# Patient Record
Sex: Female | Born: 1990 | Race: Black or African American | Hispanic: No | Marital: Single | Smoking: Never smoker
Health system: Southern US, Community
[De-identification: ages and names within clinical notes are randomized; demographics above are authoritative.]

## PROBLEM LIST (undated history)

## (undated) DIAGNOSIS — Z789 Other specified health status: Secondary | ICD-10-CM

## (undated) HISTORY — PX: CHOLECYSTECTOMY: SHX55

---

## 2010-12-17 ENCOUNTER — Emergency Department (HOSPITAL_COMMUNITY): Payer: Federal, State, Local not specified - PPO

## 2010-12-17 ENCOUNTER — Inpatient Hospital Stay (HOSPITAL_COMMUNITY)
Admission: EM | Admit: 2010-12-17 | Discharge: 2010-12-20 | DRG: 494 | Disposition: A | Discharge status: Home / Self Care | Payer: Federal, State, Local not specified - PPO | Attending: General Surgery | Admitting: General Surgery

## 2010-12-17 DIAGNOSIS — K8000 Calculus of gallbladder with acute cholecystitis without obstruction: Principal | ICD-10-CM | POA: Diagnosis present

## 2010-12-17 LAB — CBC
HCT: 39.1 % (ref 36.0–46.0)
MCH: 27.7 pg (ref 26.0–34.0)
MCHC: 34.8 g/dL (ref 30.0–36.0)
RDW: 13.2 % (ref 11.5–15.5)

## 2010-12-17 LAB — DIFFERENTIAL
Basophils Absolute: 0 10*3/uL (ref 0.0–0.1)
Basophils Relative: 1 % (ref 0–1)
Eosinophils Relative: 4 % (ref 0–5)
Monocytes Absolute: 0.5 10*3/uL (ref 0.1–1.0)

## 2010-12-17 LAB — URINALYSIS, ROUTINE W REFLEX MICROSCOPIC
Ketones, ur: NEGATIVE mg/dL
Protein, ur: NEGATIVE mg/dL
Urobilinogen, UA: 1 mg/dL (ref 0.0–1.0)

## 2010-12-17 LAB — HEPATIC FUNCTION PANEL
ALT: 197 U/L — ABNORMAL HIGH (ref 0–35)
AST: 283 U/L — ABNORMAL HIGH (ref 0–37)
Albumin: 3.4 g/dL — ABNORMAL LOW (ref 3.5–5.2)
Alkaline Phosphatase: 139 U/L — ABNORMAL HIGH (ref 39–117)
Total Protein: 7.1 g/dL (ref 6.0–8.3)

## 2010-12-17 LAB — URINE MICROSCOPIC-ADD ON

## 2010-12-17 LAB — BASIC METABOLIC PANEL
BUN: 7 mg/dL (ref 6–23)
CO2: 25 mEq/L (ref 19–32)
Calcium: 9.3 mg/dL (ref 8.4–10.5)
Creatinine, Ser: 0.91 mg/dL (ref 0.4–1.2)
GFR calc Af Amer: >60 mL/min (ref >60)

## 2010-12-17 LAB — POCT PREGNANCY, URINE: Preg Test, Ur: NEGATIVE

## 2010-12-18 LAB — CBC
HCT: 38.2 % (ref 36.0–46.0)
Hemoglobin: 13.4 g/dL (ref 12.0–15.0)
MCH: 28.1 pg (ref 26.0–34.0)
MCHC: 35.1 g/dL (ref 30.0–36.0)
MCV: 80.1 fL (ref 78.0–100.0)
RBC: 4.77 MIL/uL (ref 3.87–5.11)

## 2010-12-18 LAB — COMPREHENSIVE METABOLIC PANEL
CO2: 23 mEq/L (ref 19–32)
Calcium: 8.7 mg/dL (ref 8.4–10.5)
Chloride: 106 mEq/L (ref 96–112)
Creatinine, Ser: 0.92 mg/dL (ref 0.4–1.2)
GFR calc non Af Amer: >60 mL/min (ref >60)
Glucose, Bld: 92 mg/dL (ref 70–99)
Total Bilirubin: 1 mg/dL (ref 0.3–1.2)

## 2010-12-18 LAB — SURGICAL PCR SCREEN: Staphylococcus aureus: NEGATIVE

## 2010-12-19 ENCOUNTER — Inpatient Hospital Stay (HOSPITAL_COMMUNITY): Payer: Federal, State, Local not specified - PPO

## 2010-12-19 ENCOUNTER — Other Ambulatory Visit: Payer: Self-pay | Admitting: General Surgery

## 2010-12-19 LAB — HEPATIC FUNCTION PANEL
Albumin: 2.9 g/dL — ABNORMAL LOW (ref 3.5–5.2)
Alkaline Phosphatase: 115 U/L (ref 39–117)
Indirect Bilirubin: 0.6 mg/dL (ref 0.3–0.9)
Total Bilirubin: 0.8 mg/dL (ref 0.3–1.2)
Total Protein: 6.3 g/dL (ref 6.0–8.3)

## 2011-01-04 NOTE — Op Note (Signed)
NAMEMARGOT, ORIORDAN         ACCOUNT NO.:  [id number]  MEDICAL RECORD NO.:  [id number]           PATIENT TYPE:  I  LOCATION:  5009                         FACILITY:  [facility]  PHYSICIAN:  Cherylynn Ridges, M.D.    DATE OF BIRTH:  Jul 23, 1991  DATE OF PROCEDURE:  12/19/2010 DATE OF DISCHARGE:                              OPERATIVE REPORT   PREOPERATIVE DIAGNOSES:  Cholelithiasis and acute cholecystitis.  POSTOPERATIVE DIAGNOSES:  Cholelithiasis and acute cholecystitis.  PROCEDURE:  Laparoscopic cholecystectomy with cholangiogram.  SURGEON:  Cherylynn Ridges, MD  ANESTHESIA:  General endotracheal.  ESTIMATED BLOOD LOSS:  Less than 20 mL.  No complications.  Condition stable.  FINDINGS:  Mildly edematous acutely inflamed gallbladder with normal intraoperative cholangiogram.  No ductal filling defects.  No dilatation.  INDICATIONS FOR OPERATION:  The patient is a 20 year old recent severe attack of biliary colic and a history of 4 months of pain who now comes in after being admitted for a laparoscopic cholecystectomy.  OPERATION:  The patient was taken to the operating room, placed on table in supine position.  After an adequate general endotracheal anesthetic was administered, she was prepped and draped in usual sterile manner exposing the midline and the entire abdomen.  After proper time-out was performed identifying the patient and procedure to be performed, an infraumbilical midline incision was made using #15 blade, taken down to the midline fascia.  We grabbed the fascial Kocher clamps x2 and then incised between the clamps using a 15 blade.  We then bluntly dissected down into the peritoneal cavity using the Kelly clamp.  We passed a purse-string suture of 0 Vicryl around the fascial opening, then inserted a Hassan cannula into the peritoneal cavity securing it in place with a purse-string.  We insufflated carbon dioxide gas up to a maximal intraabdominal pressure of  15 mmHg.  Once this was done, two right costal margin, 5-mm cannulas and a subxiphoid 5-mm cannula were passed under direct vision. Once all cannulas were in place, the patient was placed in reverse Trendelenburg and the left-side was tilted down.  The gallbladder was mildly edematous but we were able to grasp and retract towards the anterior abdominal wall and the right upper quadrant.  A second grasper was passed onto the infundibulum.  Once that was done, we were able to dissect out the peritoneum overlying the triangle of Fallot and hepatic duodenal triangle isolating both the cystic duct and the cystic artery.  Four windows were placed around each structure.  We clipped the cystic duct along the gallbladder side making cholecystodochotomy using laparoscopic scissor, then inserted Cook catheter which had been passed through the anterior abdominal wall, flushed and then performed a cholangiogram showing good flow into the duodenum, good proximal flow, no intraductal filling defects, no dilatation of what appeared to be very small ductal structures.  Once cholangiogram was completed, we retrieved the catheter, removed the clip that inserted it, clipped the distal cystic duct x3 and then transected it.  We isolated the branch of the right hepatic artery going into the gallbladder with the cystic artery, clipped it proximally and distally x2 and transected.  We then  dissected out the gallbladder from its bed with minimal difficulty retrieving from the infraumbilical site using an EndoCatch bag.  We irrigated the abdomen with about a liter of saline solution aspirating most of the fluid and gas from above the liver as we removed all cannulas.  The infraumbilical fascial site was closed using a purse- string suture which was in place.  We injected 0.25% Marcaine with epi at all skin sites.  We irrigated and then closed the infraumbilical skin site using running subcuticular stitch of 4-0  Monocryl.  Dermabond, Steri-Strips and Tegaderm were used to complete all dressings.  There was no bleeding from the liver bed prior to closure, electrocautery was not required and no drainage of bile.  All counts were correct including needles, sponges and instruments.     Cherylynn Ridges, M.D.     JOW/MEDQ  D:  12/19/2010  T:  12/19/2010  Job:  [id number]  Electronically Signed by Jimmye Norman M.D. on 01/04/2011 04:10:34 PM

## 2011-01-05 NOTE — H&P (Signed)
Martinez, Belinda Martinez         ACCOUNT NO.:  [id number]  MEDICAL RECORD NO.:  [id number]           PATIENT TYPE:  I  LOCATION:  5009                         FACILITY:  [facility]  PHYSICIAN:  Almond Lint, MD       DATE OF BIRTH:  [birth year]  DATE OF ADMISSION:  12/17/2010 DATE OF DISCHARGE:                             HISTORY & PHYSICAL   CHIEF COMPLAINT:  Abdominal pain.  REQUESTING PHYSICIAN:  Marisa Severin, MD  HISTORY OF PRESENT ILLNESS:  Belinda Martinez is a 20 year old female who has had around 4 months of abdominal pain.  It used to be around once a month and then it started going to like once a week and now the episodes have started coming closer and closer together.  She had a bad episode that started 2 days ago and did not get better until she came to the ER today and received pain medication.  Whenever she has the pain, it is associated with nausea and vomiting and it is around 30 minutes after she eats.  At this time when the pain came on, it did not go away and has gotten worse.  She has not experienced pain like this ever before the last 4 months, and she has experienced a little bit of bloating and belching and diarrhea with the pain but not any shortness of breath or chest pain or bloody bowel movements or bloody emesis.  PAST MEDICAL HISTORY:  Negative.  PAST SURGICAL HISTORY:  Negative.  FAMILY HISTORY:  Negative for any members with gallbladder disease.  SOCIAL HISTORY:  She is studying psychology and is accompanied by her mother and does not abuse any substances.  REVIEW OF SYSTEMS:  Otherwise negative x11.  MEDICATIONS:  Oral contraceptives.  ALLERGIES:  None.  PHYSICAL EXAMINATION:  VITAL SIGNS:  Temperature 98.3, pulse 69, blood pressure 147/80, respiratory rate 80, sats 98% on room air. GENERAL:  She is alert and oriented x3, in no acute distress. HEENT:  Normocephalic and atraumatic.  Sclerae are anicteric. NECK:  Supple.  No lymphadenopathy.  No  thyromegaly.  Trachea is midline. PSCYH:  Mood and affect are anxious. LUNGS:  Clear to auscultation bilaterally.  No wheezing, rhonchi. HEART:  Regular rate and rhythm.  No murmurs. ABDOMEN:  Soft, nondistended, minimally tender in the epigastrium.  No hernias are seen.  No surgical scars.  The pelvis is stable. MUSCULOSKELETAL:  No gross deformities.  Palpable pulses in all four extremities.  Good strength and sensation in all four extremities.  LABORATORY DATA:  Bilirubins 2, AST and ALT 283 and 197, alk phos 137, sodium 138, potassium 4.1, chloride 105, CO2 75, BUN 7, creatinine 0.91, glucose 105, calcium 9.3, lipase 29.  White count 4.2, hemoglobin 13.6, hematocrit 39.1, platelet count 346,000.  UA shows large amount of hemoglobin.  UPG is negative.  Ultrasound is positive for stones.  IMPRESSION:  Belinda Martinez is a 20 year old female with cholelithiasis and possible choledocholithiasis.  We will admit her for IV fluids and recheck her LFTs and plan to do a laparoscopic cholecystectomy as soon as possible.  If her liver function test increase then she will need MRCP or  ERCP.  This was discussed with the patient and her mother.     Almond Lint, MD     FB/MEDQ  D:  12/18/2010  T:  12/18/2010  Job:  [id number]  Electronically Signed by Almond Lint MD on 01/05/2011 03:49:30 PM

## 2011-01-08 NOTE — Discharge Summary (Signed)
  Belinda Martinez, Belinda Martinez         ACCOUNT NO.:  [id number]  MEDICAL RECORD NO.:  [id number]           PATIENT TYPE:  I  LOCATION:  5009                         FACILITY:  [facility]  PHYSICIAN:  Thornton Park. Daphine Deutscher, MD  DATE OF BIRTH:  [birth year]  DATE OF ADMISSION:  12/17/2010 DATE OF DISCHARGE:  12/20/2010                              DISCHARGE SUMMARY   HISTORY OF PRESENT ILLNESS:  Ms. Canale is a pleasant 20 year old female who developed some abdominal pain most of the time postprandially associated with nausea and vomiting.  She presented and was found to have evidence of gallstones and elevated liver enzymes possibly consistent with choledocholithiasis.  However, decision was made to admit her for observation and then subsequent operative intervention.  SUMMARY OF HOSPITAL COURSE:  The patient was admitted on December 17, 2010, by Dr. Almond Lint.  She had repeat labs that showed her bilirubin diminished to 1.0.  Therefore, decision was made to take the patient to the operating room.  She underwent laparoscopic cholecystectomy with cholangiogram by Dr. Jimmye Norman.  Cholangiogram was negative for any evidence of retained stones, defects, and good emptying into the duodenum.  She tolerated the procedure well and was admitted to the floor in a stable condition on postoperative day #1.  No significant postoperative issues were encountered.  She was eating regular diet, voiding well and was ready for discharge home.  DISCHARGE DIAGNOSIS:  Acute cholecystitis with probable past choledocholithiasis now status post laparoscopic cholecystectomy with negative cholangiogram.  DISCHARGE MEDICATIONS:  The patient was given prescription for Percocet 1-2 tablets q.6 h. p.r.n. pain.  She is also to resume her home medicine which is birth control pills on a daily basis.  She was given preprinted discharge instructions and asked to follow up in approximately 2-3 weeks' time for  evaluation.     Brayton El, PA-C   ______________________________ Thornton Park Daphine Deutscher, MD    KB/MEDQ  D:  12/31/2010  T:  12/31/2010  Job:  [id number]  Electronically Signed by Brayton El  on 01/04/2011 03:59:27 PM Electronically Signed by Luretha Murphy MD on 01/08/2011 08:35:46 AM

## 2011-02-02 ENCOUNTER — Encounter (INDEPENDENT_AMBULATORY_CARE_PROVIDER_SITE_OTHER): Payer: Self-pay | Admitting: General Surgery

## 2011-06-16 ENCOUNTER — Other Ambulatory Visit: Payer: Self-pay | Admitting: Obstetrics and Gynecology

## 2012-07-10 ENCOUNTER — Other Ambulatory Visit: Payer: Self-pay | Admitting: Obstetrics and Gynecology

## 2015-04-18 LAB — OB RESULTS CONSOLE ABO/RH: RH Type: POSITIVE

## 2015-04-18 LAB — OB RESULTS CONSOLE RPR: RPR: NONREACTIVE

## 2015-04-18 LAB — OB RESULTS CONSOLE ANTIBODY SCREEN: ANTIBODY SCREEN: NEGATIVE

## 2015-04-18 LAB — OB RESULTS CONSOLE HIV ANTIBODY (ROUTINE TESTING): HIV: NONREACTIVE

## 2015-04-18 LAB — OB RESULTS CONSOLE RUBELLA ANTIBODY, IGM: Rubella: IMMUNE

## 2015-04-18 LAB — OB RESULTS CONSOLE HEPATITIS B SURFACE ANTIGEN: Hepatitis B Surface Ag: NEGATIVE

## 2015-04-28 LAB — OB RESULTS CONSOLE GC/CHLAMYDIA
Chlamydia: NEGATIVE
GC PROBE AMP, GENITAL: NEGATIVE

## 2015-06-13 ENCOUNTER — Other Ambulatory Visit (HOSPITAL_COMMUNITY): Payer: Self-pay | Admitting: Obstetrics and Gynecology

## 2015-06-13 DIAGNOSIS — O283 Abnormal ultrasonic finding on antenatal screening of mother: Secondary | ICD-10-CM

## 2015-06-13 DIAGNOSIS — Z3A21 21 weeks gestation of pregnancy: Secondary | ICD-10-CM

## 2015-06-13 DIAGNOSIS — Z3689 Encounter for other specified antenatal screening: Secondary | ICD-10-CM

## 2015-06-20 ENCOUNTER — Ambulatory Visit (HOSPITAL_COMMUNITY)
Admission: RE | Admit: 2015-06-20 | Discharge: 2015-06-20 | Disposition: A | Discharge status: Home / Self Care | Payer: Federal, State, Local not specified - PPO | Source: Ambulatory Visit | Attending: Obstetrics and Gynecology | Admitting: Obstetrics and Gynecology

## 2015-06-20 ENCOUNTER — Encounter (HOSPITAL_COMMUNITY): Payer: Self-pay

## 2015-06-20 ENCOUNTER — Other Ambulatory Visit (HOSPITAL_COMMUNITY): Payer: Self-pay | Admitting: Obstetrics and Gynecology

## 2015-06-20 ENCOUNTER — Encounter (HOSPITAL_COMMUNITY): Payer: Self-pay | Admitting: Obstetrics and Gynecology

## 2015-06-20 DIAGNOSIS — O358XX0 Maternal care for other (suspected) fetal abnormality and damage, not applicable or unspecified: Secondary | ICD-10-CM | POA: Insufficient documentation

## 2015-06-20 DIAGNOSIS — O283 Abnormal ultrasonic finding on antenatal screening of mother: Secondary | ICD-10-CM | POA: Insufficient documentation

## 2015-06-20 DIAGNOSIS — D573 Sickle-cell trait: Secondary | ICD-10-CM | POA: Insufficient documentation

## 2015-06-20 DIAGNOSIS — Z3A21 21 weeks gestation of pregnancy: Secondary | ICD-10-CM | POA: Insufficient documentation

## 2015-06-20 DIAGNOSIS — Z3689 Encounter for other specified antenatal screening: Secondary | ICD-10-CM

## 2015-06-20 DIAGNOSIS — Z315 Encounter for genetic counseling: Secondary | ICD-10-CM | POA: Diagnosis not present

## 2015-06-20 DIAGNOSIS — O35HXX0 Maternal care for other (suspected) fetal abnormality and damage, fetal lower extremities anomalies, not applicable or unspecified: Secondary | ICD-10-CM

## 2015-06-20 HISTORY — DX: Other specified health status: Z78.9

## 2015-06-20 NOTE — Progress Notes (Signed)
Genetic Counseling  High-Risk Gestation Note  Appointment Date:  06/20/2015 Referred By: Belinda Salina, MD Date of Birth:  06/25/1991 Partner:  Belinda Martinez   Pregnancy History: G1P0 Estimated Date of Delivery: 10/30/15 Estimated Gestational Age: 53w1dAttending: MRenella Cunas MD   I met with Ms. SDoreene Burkeand her partner, Belinda Martinez for genetic counseling because of abnormal ultrasound findings. They were accompanied by the patient'Belinda mother. Belinda Martinez, Belinda Martinez assisted with genetic counseling under my direct supervision.   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 Ultrasound visualized right club foot and echogenic intracardiac focus. Remaining visualized fetal anatomy appeared normal. Complete ultrasound results reported separately.   We discussed that clubfoot/feet is a term that actually describes three distinct anomalies (talipes equinovarus, talipes calcaneovalgus, and metatarsus varus) and occurs in 1 in 1000 births. The most common type of clubfeet, talipes equinovarus, is characterized by forefoot adduction with supination, heel varus, and ankle equines, which cannot be brought back to a neutral position. They were counseled that clubfeet can be an isolated difference, occur as a feature of an underlying syndrome, or the result of neurological impairment. We discussed that the most likely mode of inheritance for  nonsyndromic, isolated clubfoot/feet is multifactorial. There is a known genetic component for multifactorial clubfoot, as demonstrated by twin studies; environmental factors also play a role, including infection, drugs, and intrauterine environment (oligohydramnios, fetal positioning).   While the majority of cases of clubfoot/feet are isolated, it is a feature in more than 200 known genetic syndromes, including both chromosomal and single gene conditions. We reviewed chromosomes, nondisjunction and the features of Down syndrome, trisomy 147 and 181 We discussed that the risk for other chromosome aberrations is slightly increased (microdeletions, microduplications, insertions, translocations). We reviewed single gene conditions including common inheritance patterns and associated risks for recurrence. We reviewed the normal results of Ms. RSchlauchFirst trimester screen and the associated reduction in risks for fetal aneuploidy. An isolated echogenic focus is generally believed to be a normal variation without any concerns for the pregnancy.  Isolated echogenic cardiac foci are not associated with congenital heart defects in the baby or compromised cardiac function after birth.  However, an echogenic cardiac focus is associated with a slightly increased chance for Down syndrome in the pregnancy.   This couple was counseled regarding the option of noninvasive prenatal screening (NIPS)/cell free fetal DNA (cffDNA) testing. We reviewed that although highly specific and sensitive, this testing is not considered to be diagnostic and does not detect all chromosome aberrations. We reviewed the detection and false positive rates of NIPS. We then discussed the option of amniocentesis, including the limitations, benefits, and risks. They understand that amniocentesis allows evaluation of the fetal chromosomes, but cannot detect all genetic conditions. Specifically, we discussed that single gene conditions are difficult  to diagnose prenatally unless a specific condition is suspected based on additional ultrasound findings or family history.  After thoughtful consideration, this couple declined NIPS and amniocentesis today.   We discussed the  option of meeting with a pediatric orthopedic specialist to discuss expectant management and treatment of clubfeet. We will facilitate a consultation with [facility] pediatric orthopedics. We also discussed additional available options for treatment centers in the surrounding area.   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 She is otherwise healthy. We discussed that this could possibly fit with a type of club foot but it was unclear of the specific medical issue by this description. In the case of a type of clubfoot, this would increase the chance for the clubfoot in the current pregnancy to be due to an underlying familial and possibly multifactorial cause. Additional information is needed to accurately assess the implications of this medical history for the current pregnancy.   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 The father of the pregnancy reported African American ancestry, no family history of sickle cell trait or sickle cell disease, and no consanguinity to Belinda Martinez. He reported that he has not yet had screening for sickle cell trait. We discussed that sickle cell anemia (SCA) affects the shape and function of the red blood cell by producing abnormal hemoglobin. They were counseled that hemoglobin is a protein in the RBCs that carries oxygen to the body'Belinda organs. Individuals who have SCA have a changes within the genes that codes for hemoglobin. This couple was counseled that SCA is inherited in an autosomal recessive manner, and occurs when both copies of the hemoglobin gene are  changed and produce an abnormal hemoglobin Belinda. Typically, one abnormal gene for the production of hemoglobin Belinda is inherited from each parent. A carrier of SCA has one altered copy of the gene for hemoglobin and one typical working copy. Carriers of recessive conditions typically do not have symptoms related to the condition because they still have one functioning copy of the gene, and thus some production of the typical protein coded for by that gene.  Given the recessive inheritance, we discussed the importance of understanding Belinda Martinez carrier status in order to accurately predict the risk of a hemoglobinopathy in the fetus. Prior to screening, the risk for sickle cell anemia in the current pregnancy is approximately 1 in 48, assuming the general population carrier frequency for the father of the pregnancy. They understand that an accurate risk assessment cannot be performed without further information. However, assuming that Belinda Martinez does not have SCT or any other hemoglobin variant, the fetus would not be expected to have an increased risk for a hemoglobinopathy. He is planning to pursue screening through [employer] the [place]. We also reviewed the availability of newborn screening in [place] for hemoglobinopathies. Without further information regarding the provided family history, an accurate genetic risk cannot be calculated. Further genetic counseling is warranted if more information is obtained.  Belinda Martinez denied exposure to environmental toxins or chemical agents. She denied the use of alcohol, tobacco or street drugs. She denied significant viral illnesses during the course of her pregnancy. Her medical and surgical histories were noncontributory.   I counseled this couple regarding the above risks and available options.  The approximate face-to-face time with the genetic counselor was 45 minutes.  Chipper Oman, MS Certified Genetic  Counselor 06/20/2015

## 2015-06-23 ENCOUNTER — Other Ambulatory Visit (HOSPITAL_COMMUNITY): Payer: Self-pay | Admitting: Obstetrics and Gynecology

## 2015-06-24 ENCOUNTER — Ambulatory Visit (HOSPITAL_COMMUNITY): Payer: Federal, State, Local not specified - PPO

## 2015-06-24 ENCOUNTER — Encounter (HOSPITAL_COMMUNITY): Payer: Federal, State, Local not specified - PPO

## 2015-06-26 ENCOUNTER — Encounter (HOSPITAL_COMMUNITY): Payer: Self-pay

## 2015-06-30 ENCOUNTER — Encounter (HOSPITAL_COMMUNITY): Payer: Federal, State, Local not specified - PPO

## 2015-06-30 ENCOUNTER — Ambulatory Visit (HOSPITAL_COMMUNITY): Payer: Federal, State, Local not specified - PPO

## 2015-07-29 ENCOUNTER — Other Ambulatory Visit (HOSPITAL_COMMUNITY): Payer: Self-pay | Admitting: Obstetrics and Gynecology

## 2015-07-29 DIAGNOSIS — O283 Abnormal ultrasonic finding on antenatal screening of mother: Secondary | ICD-10-CM

## 2015-07-29 DIAGNOSIS — Z3A27 27 weeks gestation of pregnancy: Secondary | ICD-10-CM

## 2015-07-31 ENCOUNTER — Encounter (HOSPITAL_COMMUNITY): Payer: Self-pay

## 2015-07-31 ENCOUNTER — Ambulatory Visit (HOSPITAL_COMMUNITY)
Admission: RE | Admit: 2015-07-31 | Discharge: 2015-07-31 | Disposition: A | Discharge status: Home / Self Care | Payer: Federal, State, Local not specified - PPO | Source: Ambulatory Visit | Attending: Obstetrics and Gynecology | Admitting: Obstetrics and Gynecology

## 2015-07-31 ENCOUNTER — Other Ambulatory Visit (HOSPITAL_COMMUNITY): Payer: Self-pay | Admitting: Obstetrics and Gynecology

## 2015-07-31 VITALS — BP 114/72 | HR 68 | Wt 250.2 lb

## 2015-07-31 DIAGNOSIS — Q6601 Congenital talipes equinovarus, right foot: Secondary | ICD-10-CM

## 2015-07-31 DIAGNOSIS — Z3A27 27 weeks gestation of pregnancy: Secondary | ICD-10-CM | POA: Diagnosis not present

## 2015-07-31 DIAGNOSIS — O26842 Uterine size-date discrepancy, second trimester: Secondary | ICD-10-CM | POA: Insufficient documentation

## 2015-07-31 DIAGNOSIS — O35HXX0 Maternal care for other (suspected) fetal abnormality and damage, fetal lower extremities anomalies, not applicable or unspecified: Secondary | ICD-10-CM

## 2015-07-31 DIAGNOSIS — O358XX0 Maternal care for other (suspected) fetal abnormality and damage, not applicable or unspecified: Secondary | ICD-10-CM | POA: Diagnosis present

## 2015-07-31 DIAGNOSIS — O283 Abnormal ultrasonic finding on antenatal screening of mother: Secondary | ICD-10-CM

## 2015-08-07 ENCOUNTER — Encounter (HOSPITAL_COMMUNITY): Payer: Self-pay

## 2015-08-21 ENCOUNTER — Encounter (HOSPITAL_COMMUNITY): Payer: Self-pay

## 2015-08-21 ENCOUNTER — Ambulatory Visit (HOSPITAL_COMMUNITY)
Admission: RE | Admit: 2015-08-21 | Discharge: 2015-08-21 | Disposition: A | Discharge status: Home / Self Care | Payer: Federal, State, Local not specified - PPO | Source: Ambulatory Visit | Attending: Obstetrics and Gynecology | Admitting: Obstetrics and Gynecology

## 2015-08-21 DIAGNOSIS — O26843 Uterine size-date discrepancy, third trimester: Secondary | ICD-10-CM | POA: Diagnosis not present

## 2015-08-21 DIAGNOSIS — O358XX0 Maternal care for other (suspected) fetal abnormality and damage, not applicable or unspecified: Secondary | ICD-10-CM

## 2015-08-21 DIAGNOSIS — Z3A29 29 weeks gestation of pregnancy: Secondary | ICD-10-CM | POA: Insufficient documentation

## 2015-08-21 DIAGNOSIS — O283 Abnormal ultrasonic finding on antenatal screening of mother: Secondary | ICD-10-CM | POA: Diagnosis not present

## 2015-08-21 DIAGNOSIS — O35HXX0 Maternal care for other (suspected) fetal abnormality and damage, fetal lower extremities anomalies, not applicable or unspecified: Secondary | ICD-10-CM

## 2015-08-31 NOTE — L&D Delivery Note (Addendum)
Delivery Note  TC from nurse to stand-by for delivery - Dr Cherly Hensen en route  Delivery of a viable female at 2318 by nurse with CNM entry into room (ROA) Single loose nuchal cord noted by nurse at delivery of head  terminal meconium and first void at time of delivery  CNM assumed care after baby passed to mother for skin-to-skin Cord double clamped after cessation of pulsation, cut by [name] Cord blood sample collected   Collection of cord blood donation completed  Third Stage: Placenta delivered [facility] intact with 3 VC @ 2325 Placenta disposition: pathology Uterine tone firm / bleeding scant  no laceration identified  Est. Blood Loss (mL):  Complications: none  Mom to postpartum.  Baby to Couplet care / Skin to Skin.  Newborn: Birth Weight: 5 pounds 6 ounces Apgar Scores: 7-9 Feeding planned: breast  Marlinda Mike CNM, MSN, FACNM 10/21/2015, 11:34 PM  2/22 /17Addendum: verbal request for placenta to be sent to path. This am, called to confirm placenta was sent and was informed by RN  that the specimen was already discarded after tech took it out of the room

## 2015-10-09 LAB — OB RESULTS CONSOLE GBS: STREP GROUP B AG: NEGATIVE

## 2015-10-21 ENCOUNTER — Inpatient Hospital Stay (HOSPITAL_COMMUNITY): Payer: Federal, State, Local not specified - PPO | Admitting: Anesthesiology

## 2015-10-21 ENCOUNTER — Encounter (HOSPITAL_COMMUNITY): Payer: Self-pay | Admitting: *Deleted

## 2015-10-21 ENCOUNTER — Inpatient Hospital Stay (HOSPITAL_COMMUNITY)
Admission: AD | Admit: 2015-10-21 | Discharge: 2015-10-23 | DRG: 775 | Disposition: A | Discharge status: Home / Self Care | Payer: Federal, State, Local not specified - PPO | Source: Ambulatory Visit | Attending: Obstetrics and Gynecology | Admitting: Obstetrics and Gynecology

## 2015-10-21 DIAGNOSIS — Z6841 Body Mass Index (BMI) 40.0 and over, adult: Secondary | ICD-10-CM

## 2015-10-21 DIAGNOSIS — Z3A38 38 weeks gestation of pregnancy: Secondary | ICD-10-CM | POA: Diagnosis not present

## 2015-10-21 DIAGNOSIS — O4292 Full-term premature rupture of membranes, unspecified as to length of time between rupture and onset of labor: Secondary | ICD-10-CM | POA: Diagnosis present

## 2015-10-21 DIAGNOSIS — O99013 Anemia complicating pregnancy, third trimester: Secondary | ICD-10-CM | POA: Diagnosis present

## 2015-10-21 DIAGNOSIS — Z833 Family history of diabetes mellitus: Secondary | ICD-10-CM | POA: Diagnosis not present

## 2015-10-21 DIAGNOSIS — O99214 Obesity complicating childbirth: Secondary | ICD-10-CM | POA: Diagnosis present

## 2015-10-21 DIAGNOSIS — Z8249 Family history of ischemic heart disease and other diseases of the circulatory system: Secondary | ICD-10-CM

## 2015-10-21 DIAGNOSIS — O358XX0 Maternal care for other (suspected) fetal abnormality and damage, not applicable or unspecified: Secondary | ICD-10-CM

## 2015-10-21 DIAGNOSIS — D573 Sickle-cell trait: Secondary | ICD-10-CM | POA: Diagnosis present

## 2015-10-21 DIAGNOSIS — O4290 Premature rupture of membranes, unspecified as to length of time between rupture and onset of labor, unspecified weeks of gestation: Secondary | ICD-10-CM | POA: Diagnosis present

## 2015-10-21 DIAGNOSIS — O35HXX0 Maternal care for other (suspected) fetal abnormality and damage, fetal lower extremities anomalies, not applicable or unspecified: Secondary | ICD-10-CM

## 2015-10-21 LAB — CBC
HCT: 35.6 % — ABNORMAL LOW (ref 36.0–46.0)
HEMOGLOBIN: 12.3 g/dL (ref 12.0–15.0)
MCH: 29.6 pg (ref 26.0–34.0)
MCHC: 34.6 g/dL (ref 30.0–36.0)
MCV: 85.8 fL (ref 78.0–100.0)
Platelets: 236 10*3/uL (ref 150–400)
RBC: 4.15 MIL/uL (ref 3.87–5.11)
RDW: 14.5 % (ref 11.5–15.5)
WBC: 6.8 10*3/uL (ref 4.0–10.5)

## 2015-10-21 LAB — POCT FERN TEST
POCT FERN TEST: NEGATIVE
POCT FERN TEST: POSITIVE

## 2015-10-21 LAB — AMNISURE RUPTURE OF MEMBRANE (ROM) NOT AT ARMC: AMNISURE: POSITIVE

## 2015-10-21 MED ORDER — OXYCODONE-ACETAMINOPHEN 5-325 MG PO TABS: 2.0000 | ORAL_TABLET | ORAL | Status: DC | PRN

## 2015-10-21 MED ORDER — LACTATED RINGERS IV SOLN
1.0000 m[IU]/min | INTRAVENOUS | Status: DC
  Administered 2015-10-21: 2 m[IU]/min via INTRAVENOUS
  Filled 2015-10-21: qty 4

## 2015-10-21 MED ORDER — OXYTOCIN 10 UNIT/ML IJ SOLN: 10.0000 [IU] | Freq: Once | INTRAMUSCULAR | Status: DC

## 2015-10-21 MED ORDER — EPHEDRINE 5 MG/ML INJ
10.0000 mg | INTRAVENOUS | Status: DC | PRN
  Filled 2015-10-21: qty 2

## 2015-10-21 MED ORDER — ACETAMINOPHEN 325 MG PO TABS: 650.0000 mg | ORAL_TABLET | ORAL | Status: DC | PRN

## 2015-10-21 MED ORDER — LACTATED RINGERS IV SOLN: 500.0000 mL | Freq: Once | INTRAVENOUS | Status: DC

## 2015-10-21 MED ORDER — ONDANSETRON HCL 4 MG/2ML IJ SOLN
4.0000 mg | Freq: Four times a day (QID) | INTRAMUSCULAR | Status: DC | PRN
  Administered 2015-10-21: 4 mg via INTRAVENOUS
  Filled 2015-10-21: qty 2

## 2015-10-21 MED ORDER — DIPHENHYDRAMINE HCL 50 MG/ML IJ SOLN: 12.5000 mg | INTRAMUSCULAR | Status: DC | PRN

## 2015-10-21 MED ORDER — LACTATED RINGERS IV SOLN: 2.5000 [IU]/h | INTRAVENOUS | Status: DC

## 2015-10-21 MED ORDER — LACTATED RINGERS IV SOLN
INTRAVENOUS | Status: DC
  Administered 2015-10-21 (×2): via INTRAVENOUS

## 2015-10-21 MED ORDER — OXYCODONE-ACETAMINOPHEN 5-325 MG PO TABS: 1.0000 | ORAL_TABLET | ORAL | Status: DC | PRN

## 2015-10-21 MED ORDER — PHENYLEPHRINE 40 MCG/ML (10ML) SYRINGE FOR IV PUSH (FOR BLOOD PRESSURE SUPPORT)
80.0000 ug | PREFILLED_SYRINGE | INTRAVENOUS | Status: DC | PRN
  Filled 2015-10-21: qty 20
  Filled 2015-10-21: qty 2

## 2015-10-21 MED ORDER — TERBUTALINE SULFATE 1 MG/ML IJ SOLN
0.2500 mg | Freq: Once | INTRAMUSCULAR | Status: DC | PRN
  Filled 2015-10-21: qty 1

## 2015-10-21 MED ORDER — FENTANYL 2.5 MCG/ML BUPIVACAINE 1/10 % EPIDURAL INFUSION (WH - ANES)
14.0000 mL/h | INTRAMUSCULAR | Status: DC | PRN
  Administered 2015-10-21: 14 mL/h via EPIDURAL
  Filled 2015-10-21: qty 125

## 2015-10-21 MED ORDER — LIDOCAINE HCL (PF) 1 % IJ SOLN
30.0000 mL | INTRAMUSCULAR | Status: DC | PRN
  Filled 2015-10-21: qty 30

## 2015-10-21 MED ORDER — PHENYLEPHRINE 40 MCG/ML (10ML) SYRINGE FOR IV PUSH (FOR BLOOD PRESSURE SUPPORT)
80.0000 ug | PREFILLED_SYRINGE | INTRAVENOUS | Status: DC | PRN
  Filled 2015-10-21: qty 2

## 2015-10-21 MED ORDER — LACTATED RINGERS IV SOLN
500.0000 mL | INTRAVENOUS | Status: DC | PRN
  Administered 2015-10-21: 500 mL via INTRAVENOUS

## 2015-10-21 MED ORDER — ZOLPIDEM TARTRATE 5 MG PO TABS: 5.0000 mg | ORAL_TABLET | Freq: Every evening | ORAL | Status: DC | PRN

## 2015-10-21 MED ORDER — BUTORPHANOL TARTRATE 2 MG/ML IJ SOLN: 2.0000 mg | INTRAMUSCULAR | Status: DC | PRN

## 2015-10-21 MED ORDER — LIDOCAINE HCL (PF) 1 % IJ SOLN
INTRAMUSCULAR | Status: DC | PRN
  Administered 2015-10-21: 4 mL via EPIDURAL
  Administered 2015-10-21: 2 mL via EPIDURAL
  Administered 2015-10-21: 4 mL via EPIDURAL

## 2015-10-21 MED ORDER — OXYTOCIN BOLUS FROM INFUSION
500.0000 mL | INTRAVENOUS | Status: DC
  Administered 2015-10-21: 500 mL via INTRAVENOUS

## 2015-10-21 NOTE — MAU Note (Signed)
Dr Cherly Hensen in for foley bulb insertion. 60cc saline inserted

## 2015-10-21 NOTE — MAU Note (Signed)
Pt presents to MAU with complaints of a thick yellow discharge since Sunday. Denies any vaginal bleeding. Reports active fetal movement

## 2015-10-21 NOTE — Consults (Signed)
  Anesthesia Pain Consult Note  Patient: Belinda Martinez, 25 y.o., female  Consult Requested by: Maxie Better, MD  Reason for Consult: CRNA Pain Management Pain 4/10 Goal 8

## 2015-10-21 NOTE — Progress Notes (Signed)
Dr Cherly Hensen notified of pt's complaints, orders received for [facility] if negative may discharge pt home

## 2015-10-21 NOTE — Anesthesia Preprocedure Evaluation (Addendum)
Anesthesia Evaluation  Patient identified by MRN, date of birth, ID band Patient awake    Reviewed: Allergy & Precautions, H&P , NPO status , Patient's Chart, lab work & pertinent test results  Airway Mallampati: II  TM Distance: >3 FB Neck ROM: full    Dental no notable dental hx.    Pulmonary neg pulmonary ROS,    Pulmonary exam normal breath sounds clear to auscultation       Cardiovascular negative cardio ROS Normal cardiovascular exam Rhythm:regular Rate:Normal     Neuro/Psych negative neurological ROS  negative psych ROS   GI/Hepatic negative GI ROS, Neg liver ROS,   Endo/Other  Morbid obesity  Renal/GU negative Renal ROS  negative genitourinary   Musculoskeletal   Abdominal   Peds  Hematology negative hematology ROS (+)   Anesthesia Other Findings Pregnancy - IOL for prolonged rupture of membranes Platelets and allergies reviewed Denies active cardiac or pulmonary symptoms, METS > 4  Denies blood thinning medications, bleeding disorders, hypertension, asthma, supine hypotension syndrome, previous anesthesia difficulties    Reproductive/Obstetrics (+) Pregnancy                            Anesthesia Physical Anesthesia Plan  ASA: III  Anesthesia Plan: Epidural   Post-op Pain Management:    Induction:   Airway Management Planned:   Additional Equipment:   Intra-op Plan:   Post-operative Plan:   Informed Consent: I have reviewed the patients History and Physical, chart, labs and discussed the procedure including the risks, benefits and alternatives for the proposed anesthesia with the patient or authorized representative who has indicated his/her understanding and acceptance.     Plan Discussed with:   Anesthesia Plan Comments:         Anesthesia Quick Evaluation

## 2015-10-21 NOTE — Anesthesia Procedure Notes (Signed)
Epidural Patient location during procedure: OB Start time: 10/21/2015 10:40 PM End time: 10/21/2015 10:43 PM  Staffing Anesthesiologist: Ameah Chanda Performed by: anesthesiologist   Preanesthetic Checklist Completed: patient identified, site marked, surgical consent, pre-op evaluation, timeout performed, IV checked, risks and benefits discussed and monitors and equipment checked  Epidural Patient position: sitting Prep: site prepped and draped and DuraPrep Patient monitoring: continuous pulse ox and blood pressure Approach: midline Location: L3-L4 Injection technique: LOR saline  Needle:  Needle type: Tuohy  Needle gauge: 17 G Needle length: 9 cm and 9 Needle insertion depth: 9 cm Catheter type: closed end flexible Catheter size: 19 Gauge Catheter at skin depth: 15 cm Test dose: negative  Assessment Events: blood not aspirated, injection not painful, no injection resistance, negative IV test and no paresthesia  Additional Notes Patient identified. Risks/Benefits/Options discussed with patient including but not limited to bleeding, infection, nerve damage, paralysis, failed block, incomplete pain control, headache, blood pressure changes, nausea, vomiting, reactions to medications, itching and postpartum back pain. Confirmed with bedside nurse the patient's most recent platelet count. Confirmed with patient that they are not currently taking any anticoagulation, have any bleeding history or any family history of bleeding disorders. Patient expressed understanding and wished to proceed. All questions were answered. Sterile technique was used throughout the entire procedure. Please see nursing notes for vital signs. Test dose was given through epidural catheter and negative prior to continuing to dose epidural or start infusion. Warning signs of high block given to the patient including shortness of breath, tingling/numbness in hands, complete motor block, or any concerning symptoms  with instructions to call for help. Patient was given instructions on fall risk and not to get out of bed. All questions and concerns addressed with instructions to call with any issues or inadequate analgesia.

## 2015-10-21 NOTE — H&P (Signed)
Belinda Martinez is a 25 y.o. female presenting @ 38 5/7 weeks with c/o ROM since Sunday. (+) FM. GBS cx neg.  . Maternal Medical History:  Reason for admission: Rupture of membranes.   Fetal activity: Perceived fetal activity is normal.    Prenatal complications: Prolonged ROM since 2/19  Prenatal Complications - Diabetes: none.    OB History    Gravida Para Term Preterm AB TAB SAB Ectopic Multiple Living   1              Past Medical History  Diagnosis Date  . Medical history non-contributory    Past Surgical History  Procedure Laterality Date  . Cholecystectomy     Family History: family history includes Diabetes in her father; Hypertension in her father. Social History:  reports that she has never smoked. She does not have any smokeless tobacco history on file. She reports that she does not drink alcohol or use illicit drugs.   Prenatal Transfer Tool  Maternal Diabetes: No Genetic Screening: Declined Maternal Ultrasounds/Referrals: Abnormal:  Findings:   Isolated EIF (echogenic intracardiac focus), Other:right club foot Fetal Ultrasounds or other Referrals:  Referred to Materal Fetal Medicine  Maternal Substance Abuse:  No Significant Maternal Medications:  None Significant Maternal Lab Results:  Lab values include: Group B Strep negative Other Comments:  SS trait. [name] declined testing saw MFM for ? Fetal growth restriction due to small  AC ROS neg  Dilation: Closed Effacement (%): Thick Station: -3 Exam by:: Belinda Martinez  Blood pressure 120/78, pulse 115, temperature 98.4 F (36.9 C), resp. rate 18, last menstrual period 01/23/2015. Exam Physical Exam  Constitutional: She is oriented to person, place, and time. She appears well-developed and well-nourished.  HENT:  Head: Atraumatic.  Eyes: EOM are normal.  Neck: Neck supple.  Cardiovascular: Regular rhythm.   Respiratory: Effort normal.  GI: Soft.  Musculoskeletal: She exhibits edema.  Neurological: She  is alert and oriented to person, place, and time.  Skin: Skin is warm and dry.   VE; light yellow pooling. Fern positive. amniosure (+) FT/50/-3 vtx Tracing: baseline 130 (+) accel to 150 irreg ctx  Prenatal labs: ABO, Rh:  O positive Antibody:  neg Rubella:  immune RPR:   nr HBsAg:   neg HIV:   nr GBS:   neg  Assessment/Plan:  Prolonged  SROM No evidence of chorio IUP @ 38 5/7 weeks SS trait P) admit routine labs. IV pitocin. Intracervical balloon, declines epidural  Belinda Martinez A 10/21/2015, 2:37 PM  Addendum. Initial attempt at placement of foley balloon was unsuccessful. SS placed. Cervix in midline. Small drop of light yellow fluid noted. Attempt x 2 with success at placement of balloon. (+) bleeding noted

## 2015-10-21 NOTE — Progress Notes (Signed)
Called at 11:15 pm regarding pt was complete and bulging membrane. While on the phone emergency light per secretary was being utilized for the pt. Marlinda Mike CNM on unit for delivery. Prior call at 10pm was related to call for repetitive early decelerations per RN. At that time pt was 5-6/100/bulging membrane, On arrival, pt has been delivered. See note

## 2015-10-22 ENCOUNTER — Encounter (HOSPITAL_COMMUNITY): Payer: Self-pay

## 2015-10-22 LAB — RPR: RPR Ser Ql: NONREACTIVE

## 2015-10-22 LAB — CBC
HCT: 33.2 % — ABNORMAL LOW (ref 36.0–46.0)
HEMOGLOBIN: 11.4 g/dL — AB (ref 12.0–15.0)
MCH: 29.5 pg (ref 26.0–34.0)
MCHC: 34.3 g/dL (ref 30.0–36.0)
MCV: 85.8 fL (ref 78.0–100.0)
Platelets: 248 10*3/uL (ref 150–400)
RBC: 3.87 MIL/uL (ref 3.87–5.11)
RDW: 14.4 % (ref 11.5–15.5)
WBC: 12.5 10*3/uL — ABNORMAL HIGH (ref 4.0–10.5)

## 2015-10-22 MED ORDER — LANOLIN HYDROUS EX OINT: TOPICAL_OINTMENT | CUTANEOUS | Status: DC | PRN

## 2015-10-22 MED ORDER — FERROUS SULFATE 325 (65 FE) MG PO TABS
325.0000 mg | ORAL_TABLET | Freq: Two times a day (BID) | ORAL | Status: DC
  Administered 2015-10-22 – 2015-10-23 (×2): 325 mg via ORAL
  Filled 2015-10-22 (×2): qty 1

## 2015-10-22 MED ORDER — OXYCODONE HCL 5 MG PO TABS: 5.0000 mg | ORAL_TABLET | ORAL | Status: DC | PRN

## 2015-10-22 MED ORDER — SENNOSIDES-DOCUSATE SODIUM 8.6-50 MG PO TABS
2.0000 | ORAL_TABLET | ORAL | Status: DC
  Administered 2015-10-22: 2 via ORAL
  Filled 2015-10-22 (×2): qty 2

## 2015-10-22 MED ORDER — ZOLPIDEM TARTRATE 5 MG PO TABS: 5.0000 mg | ORAL_TABLET | Freq: Every evening | ORAL | Status: DC | PRN

## 2015-10-22 MED ORDER — WITCH HAZEL-GLYCERIN EX PADS: 1.0000 "application " | MEDICATED_PAD | CUTANEOUS | Status: DC | PRN

## 2015-10-22 MED ORDER — PRENATAL MULTIVITAMIN CH
1.0000 | ORAL_TABLET | Freq: Every day | ORAL | Status: DC
  Administered 2015-10-22: 1 via ORAL
  Filled 2015-10-22: qty 1

## 2015-10-22 MED ORDER — BENZOCAINE-MENTHOL 20-0.5 % EX AERO: 1.0000 "application " | INHALATION_SPRAY | CUTANEOUS | Status: DC | PRN

## 2015-10-22 MED ORDER — OXYCODONE HCL 5 MG PO TABS: 10.0000 mg | ORAL_TABLET | ORAL | Status: DC | PRN

## 2015-10-22 MED ORDER — DIBUCAINE 1 % RE OINT: 1.0000 "application " | TOPICAL_OINTMENT | RECTAL | Status: DC | PRN

## 2015-10-22 MED ORDER — DIPHENHYDRAMINE HCL 25 MG PO CAPS: 25.0000 mg | ORAL_CAPSULE | Freq: Four times a day (QID) | ORAL | Status: DC | PRN

## 2015-10-22 MED ORDER — ONDANSETRON HCL 4 MG/2ML IJ SOLN: 4.0000 mg | INTRAMUSCULAR | Status: DC | PRN

## 2015-10-22 MED ORDER — ONDANSETRON HCL 4 MG PO TABS: 4.0000 mg | ORAL_TABLET | ORAL | Status: DC | PRN

## 2015-10-22 MED ORDER — SIMETHICONE 80 MG PO CHEW: 80.0000 mg | CHEWABLE_TABLET | ORAL | Status: DC | PRN

## 2015-10-22 MED ORDER — IBUPROFEN 600 MG PO TABS
600.0000 mg | ORAL_TABLET | Freq: Four times a day (QID) | ORAL | Status: DC
  Administered 2015-10-22 – 2015-10-23 (×5): 600 mg via ORAL
  Filled 2015-10-22 (×5): qty 1

## 2015-10-22 NOTE — Lactation Note (Signed)
This note was copied from a baby's chart. Lactation Consultation Note  Patient Name: Belinda Martinez [id number] Date: 10/22/2015 Reason for consult: Initial assessment Baby at 21 hr of life and mom reports bf is going better today. She has a crescent shape scab at the base of the L nipple that she stated happened last night from a poor latch. Her RN today showed her how to get a deeper latch. Given comfort gels. Mom stated that she can manually express and has a spoon in the room. Discussed baby behavior, feeding frequency, baby belly size, voids, wt loss, breast changes, and nipple care. Given lactation handouts. Aware of OP services and support group.    Maternal Data Has patient been taught Hand Expression?: Yes  Feeding Feeding Type: Breast Fed Length of feed: 10 min  LATCH Score/Interventions Latch: Grasps breast easily, tongue down, lips flanged, rhythmical sucking.  Audible Swallowing: Spontaneous and intermittent Intervention(s): Skin to skin;Hand expression  Type of Nipple: Everted at rest and after stimulation  Comfort (Breast/Nipple): Filling, red/small blisters or bruises, mild/mod discomfort  Problem noted: Mild/Moderate discomfort;Cracked, bleeding, blisters, bruises Interventions  (Cracked/bleeding/bruising/blister): Reverse pressure Interventions (Mild/moderate discomfort): Comfort gels  Hold (Positioning): No assistance needed to correctly position infant at breast. Intervention(s): Position options;Support Pillows  LATCH Score: 9  Lactation Tools Discussed/Used WIC Program: No   Consult Status Consult Status: Follow-up Date: 10/23/15 Follow-up type: In-patient    Rulon Eisenmenger 10/22/2015, 8:33 PM

## 2015-10-22 NOTE — Anesthesia Postprocedure Evaluation (Signed)
Anesthesia Post Note  Patient: Belinda Martinez  Procedure(s) Performed: * No procedures listed *  Patient location during evaluation: Mother Baby Anesthesia Type: Epidural Level of consciousness: awake and alert Pain management: pain level controlled (states pain level 3 now;stated epidural received late in relation to delivery so not as effective) Vital Signs Assessment: post-procedure vital signs reviewed and stable Respiratory status: spontaneous breathing Cardiovascular status: stable Postop Assessment: no headache, epidural receding, patient able to bend at knees, no signs of nausea or vomiting and adequate PO intake Anesthetic complications: no    Last Vitals:  Filed Vitals:   10/22/15 0245 10/22/15 0643  BP: 126/78 132/68  Pulse: 81 82  Temp: 37.2 C 37 C  Resp: 18 18    Last Pain:  Filed Vitals:   10/22/15 0648  PainSc: 7                  Edison Pace

## 2015-10-22 NOTE — Progress Notes (Signed)
Patient ID: Belinda Martinez, female   DOB: 1990/09/10, 25 y.o.   MRN: [id number] PPD # 1 SVD  S:  Reports feeling well.             Tolerating po/ No nausea or vomiting             Bleeding is light             Pain controlled with ibuprofen (OTC)             Up ad lib / ambulatory / voiding without difficulties    Newborn  Information for the patient's newborn:  Eva, Griffo [[id number]]  female  breast feeding    O:  A & O x 3, in no apparent distress              VS:  Filed Vitals:   10/22/15 0105 10/22/15 0140 10/22/15 0245 10/22/15 0643  BP: 124/71 117/64 126/78 132/68  Pulse: 73 73 81 82  Temp:  98.7 F (37.1 C) 99 F (37.2 C) 98.6 F (37 C)  TempSrc:  Oral Oral Oral  Resp:  Height:      Weight:      SpO2:        LABS:  Recent Labs  10/21/15 1440 10/22/15 0556  WBC 6.8 12.5*  HGB 12.3 11.4*  HCT 35.6* 33.2*  PLT 236 248    Blood type: O/Positive/-- (08/19 0000)  Rubella: Immune (08/19 0000)   I&O: I/O last 3 completed shifts: In: -  Out: 159 [Blood:159]             Lungs: Clear and unlabored  Heart: regular rate and rhythm / no murmurs  Abdomen: soft, non-tender, non-distended             Fundus: firm, non-tender, U-1  Perineum: intact, no edema  Lochia: minimal  Extremities: No edema, no calf pain or tenderness, No Homans    A/P: PPD # 1  24 y.o., G1P1001   Principal Problem:   Postpartum care following vaginal delivery (2/21) Active Problems:   Prolonged spontaneous rupture of membranes   Doing well - stable status  Routine post partum orders  Anticipate discharge tomorrow    Raelyn Mora, M, MSN, CNM 10/22/2015, 8:46 AM

## 2015-10-23 MED ORDER — IBUPROFEN 600 MG PO TABS: 600.0000 mg | ORAL_TABLET | Freq: Four times a day (QID) | ORAL | Status: AC

## 2015-10-23 NOTE — Progress Notes (Signed)
PPD #2- SVD  Subjective:   Reports feeling well Tolerating po/ No nausea or vomiting Bleeding is light Pain controlled with Motrin Up ad lib / ambulatory / voiding without problems Newborn: breastfeeding    Objective:   VS: VS:  Filed Vitals:   10/22/15 0643 10/22/15 1102 10/22/15 1700 10/23/15 0535  BP: 132/68 113/48 116/64 116/60  Pulse: 82 86 82 68  Temp: 98.7 F (37.1 C) 98.7 F (37.1 C) 98.6 F (37 C) 98.1 F (36.7 C)  TempSrc: Oral Oral Oral Oral  Resp: Height:      Weight:      SpO2:        LABS:  Recent Labs  10/21/15 1440 10/22/15 0556  WBC 6.8 12.5*  HGB 12.3 11.4*  PLT 236 248   Blood type: O/Positive/-- (08/19 0000) Rubella: Immune (08/19 0000)                I&O: Intake/Output      02/22 0701 - 02/23 0700 02/23 0701 - 02/24 0700   Urine (mL/kg/hr)     Blood     Total Output       Net              Physical Exam: Alert and oriented X3 Abdomen: soft, non-tender, non-distended  Fundus: firm, non-tender, U-2 Perineum: intact Lochia: small Extremities: No edema, no calf pain or tenderness   Assessment: PPD #2  G1P1001/ S/P:spontaneous vaginal Doing well - stable for discharge home  Plan: Discharge home RX's:  Ibuprofen  po Q 6 hrs prn pain #30 Refill x 0 Follow up in 6 wks at [facility] for postpartum visit [facility] booklet given    Donette Larry, N MSN, CNM 10/23/2015, 8:59 AM

## 2015-10-23 NOTE — Discharge Summary (Signed)
Obstetric Discharge Summary Reason for Admission: rupture of membranes and 38.5 weeks, PROM Prenatal Course: complicated by ss trait, obesity Intrapartum Procedure: cervical balloon, pitocin,  epidural, light MSF, SVD Postpartum Procedures: none Complications-Operative and Postpartum: none HEMOGLOBIN  Date Value Ref Range Status  10/22/2015 11.4* 12.0 - 15.0 g/dL Final   HCT  Date Value Ref Range Status  10/22/2015 33.2* 36.0 - 46.0 % Final    Physical Exam:  General: alert, cooperative and no distress Lochia: appropriate Uterine Fundus: firm Perineum: intact DVT Evaluation: No evidence of DVT seen on physical exam. Negative Homan's sign. No cords or calf tenderness. No significant calf/ankle edema.  Discharge Diagnoses: Term Pregnancy-delivered  Discharge Information: Date: 10/23/2015 Activity: pelvic rest Diet: routine Medications: PNV and Ibuprofen Condition: stable Instructions: refer to practice specific booklet Discharge to: home Follow-up Information    Follow up with Bryona Foxworthy A, MD. Schedule an appointment as soon as possible for a visit in 6 weeks.   Specialty:  Obstetrics and Gynecology   Contact information:   [address] [place] [address] [phone]       Newborn Data: Live born female on 10/21/15 Birth Weight: 5 lb 6 oz (2438 g) APGAR: 7, 9  Home with mother.  BHAMBRI, MELANIE, N 10/23/2015, 9:00 AM

## 2015-10-23 NOTE — Lactation Note (Addendum)
This note was copied from a baby's chart. Lactation Consultation Note  Patient Name: Belinda Martinez [id number] Date: 10/23/2015 Reason for consult: Follow-up assessment Baby 29 hours old ,  Per mom baby cluster fed all night until 520 this am , since fed at 0930 for 20 mins ,  During consult baby awake , and LC observed mom latching. Needed alittle assist with depth at the breast.  Multiply swallows noted and baby released on her own after 7 mins. Nipple well rounded.  LC reviewed doc flow sheets , WNL for D/C.  Sore nipple and engorgement prevention and tx reviewed. Per mom nipples have been alittle tender and  Using the comfort gels which  are helping. LC instructed mom on the use hand pump. #24 Flange snug and increased to #27 , more comfortable.  DEBP kit given to mom with instructions if she needs to call [facility] office to do a @ week rental due to her DEBP not being mailed yet.  Mom receptive to returning for [facility] O/P apt. On 3/2 at 10 30 am. Appt. Reminder given to mom.  Mom encouraged to feed baby 8-12 times/24 hours and with feeding cues.  Mother informed of post-discharge support and given phone number to the lactation department, including services for phone call assistance; out-patient appointments; and breastfeeding support group. List of other breastfeeding resources in the community given in the handout. Encouraged mother to call for problems or concerns related to breastfeeding.  Maternal Data    Feeding Feeding Type: Breast Fed Length of feed: 7 min  LATCH Score/Interventions Latch: Grasps breast easily, tongue down, lips flanged, rhythmical sucking.  Audible Swallowing: Spontaneous and intermittent Intervention(s): Skin to skin  Type of Nipple: Everted at rest and after stimulation  Comfort (Breast/Nipple): Filling, red/small blisters or bruises, mild/mod discomfort  Interventions (Mild/moderate discomfort): Comfort gels  Hold (Positioning): Assistance needed to  correctly position infant at breast and maintain latch.  LATCH Score: 8  Lactation Tools Discussed/Used     Consult Status      Myer Haff 10/23/2015, 11:26 AM

## 2015-10-24 ENCOUNTER — Ambulatory Visit: Payer: Self-pay

## 2015-10-24 NOTE — Lactation Note (Signed)
This note was copied from a baby's chart. Lactation Consultation Note: Mother states that she was having nipple tenderness yesterday, but today she states that she feels much better with the latch technique. Mother had independently latched infant when I arrived in the room.  Mother has infant in traditional cradle hold with no pillow support. Observed that infant has a shallow latch . When infant released the breast observed pinching. Assist mother with using the tea cup hold to latch infant . Infant sustained latch for 15 mins. Observed frequent audible swallows. Latch much deeper. Infant has not had a stool in 35 hours. Advised mother to look for transitional stool. Mother has a hand pump at the bedside. Advised mother to post pump for 15 mins on each breast or hand express colostrum and supplement infant with a spoon to give additional calories. Reviewed treatment plan to prevent severe engorgement. Mother advised to continue to breastfeed 8-12 times in 24 hours as well as with all feeding cues. Encouraged mother to do frequent STS. Mother receptive to all teaching.   Patient Name: Belinda Martinez [id number] Date: 10/24/2015 Reason for consult: Follow-up assessment   Maternal Data    Feeding Feeding Type: Breast Fed  LATCH Score/Interventions Latch: Grasps breast easily, tongue down, lips flanged, rhythmical sucking.  Audible Swallowing: Spontaneous and intermittent (this improved when mom told to massage breast while baby eat)  Type of Nipple: Everted at rest and after stimulation  Comfort (Breast/Nipple): Soft / non-tender  Interventions  (Cracked/bleeding/bruising/blister):  (EBM to nipple has soothed nipples)  Hold (Positioning): No assistance needed to correctly position infant at breast.  LATCH Score: 10  Lactation Tools Discussed/Used     Consult Status      Michel Bickers 10/24/2015, 8:55 AM

## 2015-10-30 ENCOUNTER — Ambulatory Visit (HOSPITAL_COMMUNITY): Payer: Federal, State, Local not specified - PPO

## 2015-11-12 ENCOUNTER — Ambulatory Visit (HOSPITAL_COMMUNITY)
Admission: RE | Admit: 2015-11-12 | Discharge: 2015-11-12 | Disposition: A | Discharge status: Home / Self Care | Payer: Federal, State, Local not specified - PPO | Source: Ambulatory Visit | Attending: Obstetrics and Gynecology | Admitting: Obstetrics and Gynecology

## 2015-11-12 NOTE — Lactation Note (Signed)
Lactation Consult; Weight today 5- 12.1  2610 g up 2 oz from weight check on Monday. I have concerns about mom's milk supply. Due to ? tongue restriction concerns that baby is not providing good stimulation and mom is not pumping .To continue putting baby to the breast but needs more stimulation. Encouraged getting larger flanges for her pump and to pump q 3 hours at least 6-8 times/day to build milk supply.To see Dr. Maryann ConnersEttafage tomorrow No questions at present. To call prn. OP appointment made for Wed March 22 at 1 pm  Mother's reason for visit:  trouble with breast feeding Visit Type:  Feeding assessment Appointment Notes:  Mom feels baby was 2 Tayja Manzer earlier than by dates ? 36.5 Evangela Heffler Zoee [facility] temps and septic workup. Hospitalized 5 days Consult:  Initial Lactation Consultant:  Audry RilesWeeks, Jareli Highland D  ________________________________________________________________________    ________________________________________________________________________  Mother's Name: Edward QualiaShaundrieka Crosby Type of delivery:   Breastfeeding Experience:  p1  ________________________________________________________________________  Breastfeeding History (Post Discharge)  Frequency of breastfeeding:  q 3-4 hours Duration of feeding:  30-45 min  Patient does not supplement or pump.  Infant Intake and Output Assessment  Voids:  QS in 24 hrs.  Color:  Clear yellow Stools:  Qs in 24 hrs.  Color:  Yellow  ________________________________________________________________________  Maternal Breast Assessment  Breast:  Soft Nipple:  Erect  _______________________________________________________________________ Feeding Assessment/Evaluation  Initial feeding assessment:  Infant's oral assessment:  Variance  ? Tongue restriction  Mom was pumping and bottle feeding EBM and formula. For the last few days has only been nursing. Mom has not been pumping.  Zoee latched well but few swallows  noted.   Pre-feed weight:  2610 g  5- 12.1  Was 5- 10 on Monday Post-feed weight:  2622 g 5- 12.5 Amount transferred:  12 ml    Zoee nursed on the second breast for 15 min Again few swallows noted  Pre-feed weight: 2622g  5- 12.5oz Post feed weight; 2638  5- 13.1oz Amount transferred: 16 ml   Total amount pumped post feed:   Has Ameda pump but feels it is too small for her- encouraged to get larger flanges. Has Medela manual pump from us. Used larger flange with it and mom reports that feels better  Total amount transferred:  28 ml Total supplement given:  1.2 ozl

## 2015-11-19 ENCOUNTER — Ambulatory Visit (HOSPITAL_COMMUNITY): Admission: RE | Admit: 2015-11-19 | Payer: MEDICAID | Source: Ambulatory Visit

## 2017-02-13 ENCOUNTER — Encounter (HOSPITAL_COMMUNITY): Payer: Self-pay | Admitting: Emergency Medicine

## 2017-02-13 ENCOUNTER — Emergency Department (HOSPITAL_COMMUNITY)
Admission: EM | Admit: 2017-02-13 | Discharge: 2017-02-13 | Disposition: A | Discharge status: Home / Self Care | Payer: Federal, State, Local not specified - PPO | Attending: Emergency Medicine | Admitting: Emergency Medicine

## 2017-02-13 ENCOUNTER — Emergency Department (HOSPITAL_COMMUNITY): Payer: Federal, State, Local not specified - PPO

## 2017-02-13 DIAGNOSIS — R040 Epistaxis: Secondary | ICD-10-CM | POA: Diagnosis not present

## 2017-02-13 DIAGNOSIS — Z79899 Other long term (current) drug therapy: Secondary | ICD-10-CM | POA: Insufficient documentation

## 2017-02-13 DIAGNOSIS — J029 Acute pharyngitis, unspecified: Secondary | ICD-10-CM | POA: Diagnosis not present

## 2017-02-13 LAB — CBC WITH DIFFERENTIAL/PLATELET
Basophils Absolute: 0 10*3/uL (ref 0.0–0.1)
Basophils Relative: 0 %
EOS ABS: 0.2 10*3/uL (ref 0.0–0.7)
Eosinophils Relative: 3 %
HEMATOCRIT: 40.3 % (ref 36.0–46.0)
HEMOGLOBIN: 13.6 g/dL (ref 12.0–15.0)
LYMPHS ABS: 2.3 10*3/uL (ref 0.7–4.0)
Lymphocytes Relative: 26 %
MCH: 28.2 pg (ref 26.0–34.0)
MCHC: 33.7 g/dL (ref 30.0–36.0)
MCV: 83.6 fL (ref 78.0–100.0)
MONOS PCT: 8 %
Monocytes Absolute: 0.7 10*3/uL (ref 0.1–1.0)
Neutro Abs: 5.7 10*3/uL (ref 1.7–7.7)
Neutrophils Relative %: 63 %
Platelets: 352 10*3/uL (ref 150–400)
RBC: 4.82 MIL/uL (ref 3.87–5.11)
RDW: 13.5 % (ref 11.5–15.5)
WBC: 8.9 10*3/uL (ref 4.0–10.5)

## 2017-02-13 LAB — I-STAT BETA HCG BLOOD, ED (MC, WL, AP ONLY): I-stat hCG, quantitative: <5 m[IU]/mL (ref <5)

## 2017-02-13 LAB — BASIC METABOLIC PANEL
Anion gap: 10 (ref 5–15)
BUN: 11 mg/dL (ref 6–20)
CHLORIDE: 105 mmol/L (ref 101–111)
CO2: 25 mmol/L (ref 22–32)
CREATININE: 0.79 mg/dL (ref 0.44–1.00)
Calcium: 8.8 mg/dL — ABNORMAL LOW (ref 8.9–10.3)
GFR calc Af Amer: >60 mL/min (ref >60)
GFR calc non Af Amer: >60 mL/min (ref >60)
Glucose, Bld: 89 mg/dL (ref 65–99)
Potassium: 4.7 mmol/L (ref 3.5–5.1)
SODIUM: 140 mmol/L (ref 135–145)

## 2017-02-13 LAB — RAPID STREP SCREEN (MED CTR MEBANE ONLY): STREPTOCOCCUS, GROUP A SCREEN (DIRECT): NEGATIVE

## 2017-02-13 MED ORDER — OXYMETAZOLINE HCL 0.05 % NA SOLN
1.0000 | Freq: Once | NASAL | Status: AC
  Administered 2017-02-13: 1 via NASAL
  Filled 2017-02-13: qty 15

## 2017-02-13 MED ORDER — SODIUM CHLORIDE 0.9 % IV BOLUS (SEPSIS)
1000.0000 mL | Freq: Once | INTRAVENOUS | Status: AC
  Administered 2017-02-13: 1000 mL via INTRAVENOUS

## 2017-02-13 NOTE — Discharge Instructions (Signed)
Drink fluids. Get rest. This may take 1-2 weeks to get fully better. Return for worsening symptoms, including difficulty breathing, persistent fevers, confusion, or any other symptoms concerning to you.

## 2017-02-13 NOTE — ED Provider Notes (Signed)
[facility] Provider Note   CSN: [id number] Arrival date & time: 02/13/17  1243     History   Chief Complaint Chief Complaint  Patient presents with  . Epistaxis  . Sore Throat    HPI Belinda Martinez is a 26 y.o. female.  The history is provided by the patient.  Epistaxis   This is a new problem. The current episode started more than 2 days ago. The problem occurs daily. The problem has not changed since onset.Associated with: URI symptoms. The bleeding has been from the left nare. She has tried applying pressure for the symptoms.  Sore Throat  This is a new problem. The current episode started more than 2 days ago. The problem occurs constantly. The problem has been gradually worsening. Associated symptoms include headaches. Pertinent negatives include no chest pain, no abdominal pain and no shortness of breath. The symptoms are aggravated by swallowing. Nothing relieves the symptoms. She has tried acetaminophen for the symptoms. The treatment provided no relief.    26 year old female, otherwise healthy, who presents with cough, sore throat, and epistaxis. Onset of symptoms 4 days ago, gradually worsening. Has been taking TheraFlu, emergency, and cough drops without improvement. He denies any associating fevers, chills, nausea or vomiting, diarrhea, abdominal pain or urinary symptoms. Cough productive of thick phlegm. Has had blood tinged mucus from her nose, and episodic nosebleeds, resolving with pressure after 15 minutes. She states during intensive time she did have one episode of syncope when walking in her home. Has been lightheaded when walking or standing. Decreased PO intake.  Past Medical History:  Diagnosis Date  . Medical history non-contributory     Patient Active Problem List   Diagnosis Date Noted  . Postpartum care following vaginal delivery (2/21) 10/22/2015  . Prolonged spontaneous rupture of membranes 10/21/2015  . Club foot, fetal, affecting care  of mother, antepartum 06/20/2015  . Sickle cell trait (HCC) 06/20/2015    Past Surgical History:  Procedure Laterality Date  . CHOLECYSTECTOMY      OB History    Gravida Para Term Preterm AB Living   1 1 1     1    SAB TAB Ectopic Multiple Live Births         0 1       Home Medications    Prior to Admission medications   Medication Sig Start Date End Date Taking? Authorizing Provider  ibuprofen (ADVIL,MOTRIN) 600 MG tablet Take 1 tablet (600 mg total) by mouth every 6 (six) hours. 10/23/15   Donette Larry, CNM  Prenatal Vit-Fe Fumarate-FA (PRENATAL VITAMIN PO) Take 1 tablet by mouth daily.     [provider]    Family History Family History  Problem Relation Age of Onset  . Diabetes Father   . Hypertension Father     Social History Social History  Substance Use Topics  . Smoking status: Never Smoker  . Smokeless tobacco: Not on file  . Alcohol use No     Allergies   Citric acid   Review of Systems Review of Systems  Constitutional: Negative for fever.  HENT: Positive for nosebleeds, rhinorrhea and sore throat.   Respiratory: Negative for shortness of breath.   Cardiovascular: Negative for chest pain.  Gastrointestinal: Negative for abdominal pain.  Genitourinary: Negative for difficulty urinating.  Musculoskeletal: Negative for neck pain.  Allergic/Immunologic: Negative for immunocompromised state.  Neurological: Positive for headaches.  Hematological: Does not bruise/bleed easily.  All other systems reviewed and are negative.  Physical Exam Updated Vital Signs BP 134/85 (BP Location: Left Arm)   Pulse 75   Temp 99 F (37.2 C) (Oral)   Resp 18   Ht 5\' 3"  (1.6 m)   Wt 115.2 kg (254 lb)   LMP 02/06/2017   SpO2 99%   BMI 44.99 kg/m   Physical Exam Physical Exam  Nursing note and vitals reviewed. Constitutional: Well developed, well nourished, non-toxic, and in no acute distress Head: Normocephalic and atraumatic.    Mouth/Throat: Oropharynx is clear and moist. posterior oropharynx is erythematous. No exudates. Neck: Normal range of motion. Neck supple.  Nose: no active bleeding. Bilateral nares clear. Cardiovascular: Normal rate and regular rhythm.   Pulmonary/Chest: Effort normal and breath sounds normal.  Abdominal: Soft. There is no tenderness. There is no rebound and no guarding.  Musculoskeletal: Normal range of motion. no edema. Neurological: Alert, no facial droop, fluent speech, moves all extremities symmetrically Skin: Skin is warm and dry.  Psychiatric: Cooperative   ED Treatments / Results  Labs (all labs ordered are listed, but only abnormal results are displayed) Labs Reviewed  BASIC METABOLIC PANEL - Abnormal; Notable for the following:       Result Value   Calcium 8.8 (*)    All other components within normal limits  RAPID STREP SCREEN (NOT AT [facility])  CULTURE, GROUP A STREP ([facility])  CBC WITH DIFFERENTIAL/PLATELET  I-STAT BETA HCG BLOOD, ED ([facility], [facility], [facility] ONLY)    EKG  EKG Interpretation  Date/Time:  Sunday February 13 2017 14:00:34 EDT Ventricular Rate:  86 PR Interval:    QRS Duration: 84 QT Interval:  355 QTC Calculation: 425 R Axis:   78 Text Interpretation:  Sinus rhythm Normal EKg  Confirmed by Crista Curb [phone]) on 02/13/2017 5:09:26 PM       Radiology Dg Chest 2 View  Result Date: 02/13/2017 CLINICAL DATA:  Cough, sore throat and nosebleeds 4 days. EXAM: CHEST  2 VIEW COMPARISON:  None. FINDINGS: The cardiac silhouette, mediastinal and hilar contours are normal. The lungs are clear. No pleural effusion. The bony thorax is intact. IMPRESSION: No acute cardiopulmonary findings. Electronically Signed   By: Rudie Meyer M.D.   On: 02/13/2017 17:21    Procedures Procedures (including critical care time)  Medications Ordered in ED Medications  oxymetazoline (AFRIN) 0.05 % nasal spray 1 spray (1 spray Each Nare Given 02/13/17 1719)  sodium chloride 0.9 % bolus 1,000 mL  (1,000 mLs Intravenous New Bag/Given 02/13/17 1721)     Initial Impression / Assessment and Plan / ED Course  I have reviewed the triage vital signs and the nursing notes.  Pertinent labs & imaging results that were available during my care of the patient were reviewed by me and considered in my medical decision making (see chart for details).     With symptoms suggestive of viral respiratory illness. Well appearing with stable vital signs. No active nose bleeding, and discussed supportive care for this including afrin and pressure. Suspect related to her URI symptoms. She is strep negative. Protecting airway, handling secretions, normal ROM of neck. No deep neck infection. CXr visualized and w/o acute cardiopulmonary processes. EKG w/o stigmata of arrthymia. Syncopal episode likely related to decreased PO itnake. Blood work reassuring. Discussed Continued supportive care for viral URI/pharyngitis. Strict return and follow-up instructions reviewed. She expressed understanding of all discharge instructions and felt comfortable with the plan of care.   Final Clinical Impressions(s) / ED Diagnoses   Final diagnoses:  Epistaxis  Viral pharyngitis    New Prescriptions New Prescriptions   No medications on file     Lavera GuiseLiu, Debby Clyne Duo, MD 02/13/17 1858

## 2017-02-13 NOTE — ED Triage Notes (Signed)
Pt reports feeling ill and syncopal episode with sore throat Wednesday. Self-treated with thera-flu, Emergen-C, cough drops. Blood in nasal mucus onset Saturday evening. Netti pot on Friday and Saturdat,  Had Mirena IUD inserted on Friday.

## 2017-02-16 LAB — CULTURE, GROUP A STREP (THRC)

## 2017-09-25 IMAGING — CR DG CHEST 2V
2 series · 2 of 2 positions shown · non-contrast
Comparison: None.

CLINICAL DATA: Cough, sore throat and nosebleeds 4 days.

EXAM:
CHEST  2 VIEW

[w chest pa]
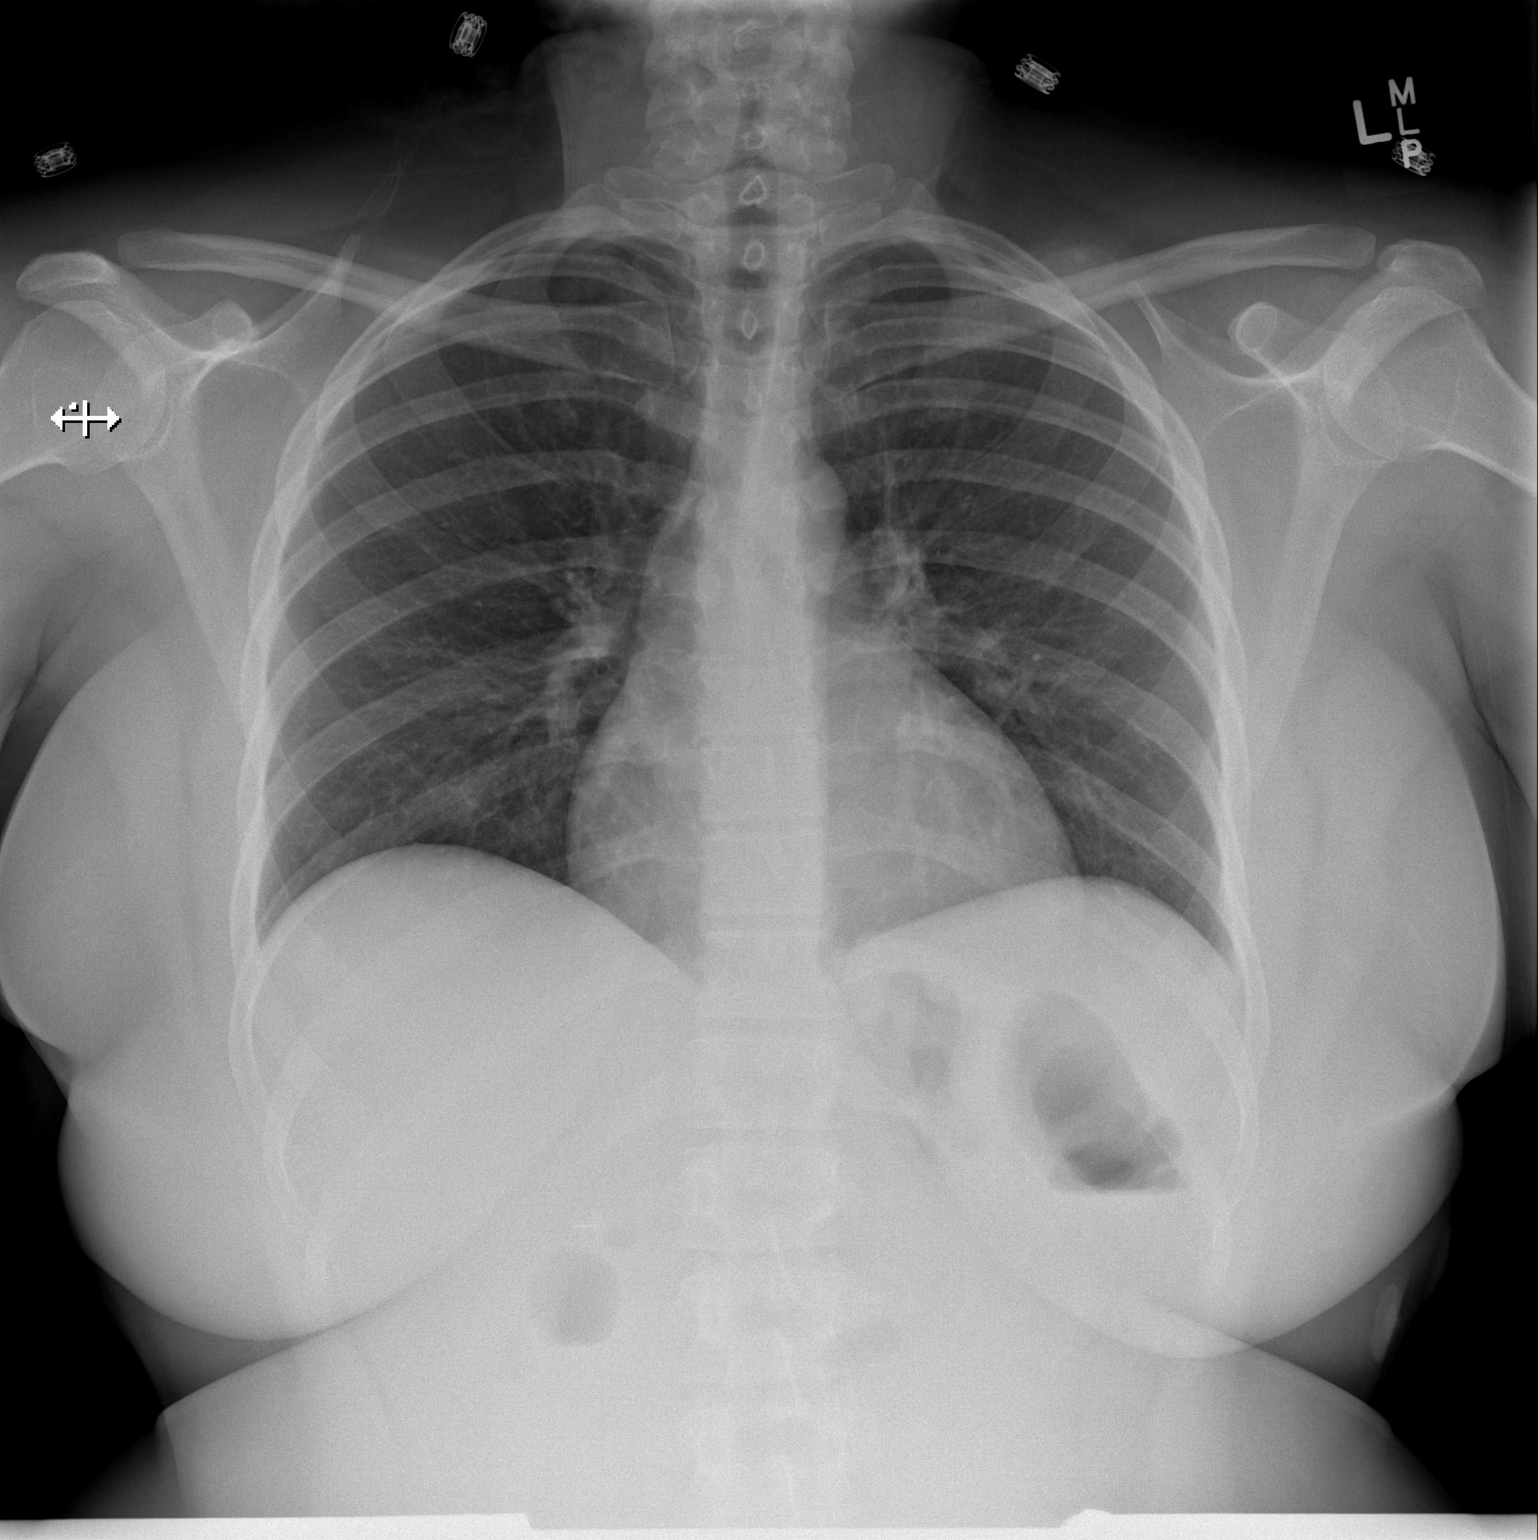

[w chest lat]
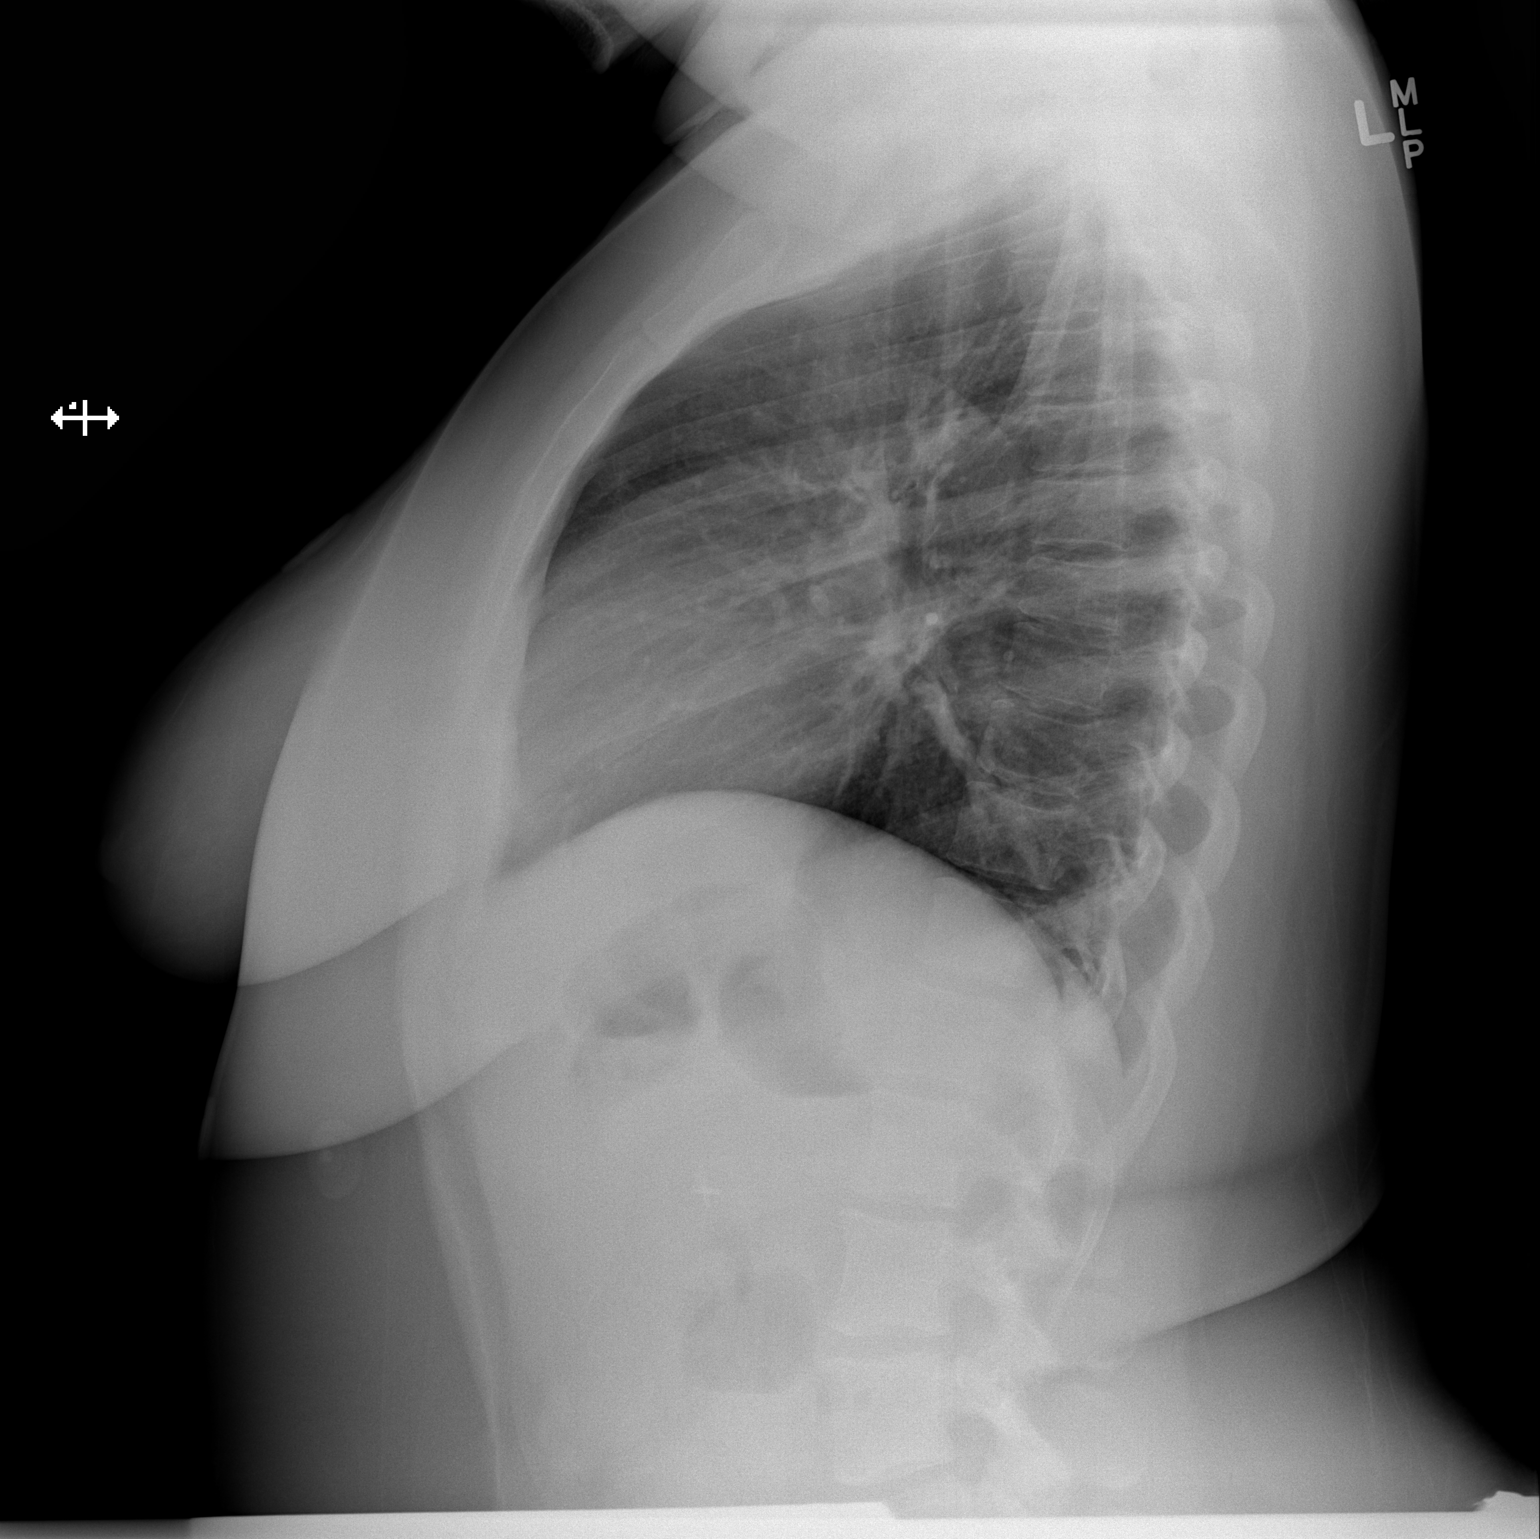

[2 of 2 positions shown; findings below may reference images not displayed]

FINDINGS: The cardiac silhouette, mediastinal and hilar contours are normal.
The lungs are clear. No pleural effusion. The bony thorax is intact.
IMPRESSION: No acute cardiopulmonary findings.

## 2018-10-27 ENCOUNTER — Other Ambulatory Visit: Payer: Self-pay

## 2018-10-27 ENCOUNTER — Encounter (HOSPITAL_COMMUNITY): Payer: Self-pay | Admitting: Emergency Medicine

## 2018-10-27 ENCOUNTER — Ambulatory Visit (HOSPITAL_COMMUNITY)
Admission: EM | Admit: 2018-10-27 | Discharge: 2018-10-27 | Disposition: A | Discharge status: Home / Self Care | Payer: Federal, State, Local not specified - PPO | Attending: Physician Assistant | Admitting: Physician Assistant

## 2018-10-27 DIAGNOSIS — J0140 Acute pansinusitis, unspecified: Secondary | ICD-10-CM

## 2018-10-27 MED ORDER — PREDNISONE 50 MG PO TABS: 50.0000 mg | ORAL_TABLET | Freq: Every day | ORAL | 0 refills | Status: AC

## 2018-10-27 MED ORDER — AMOXICILLIN-POT CLAVULANATE 875-125 MG PO TABS: 1.0000 | ORAL_TABLET | Freq: Two times a day (BID) | ORAL | 0 refills | Status: AC

## 2018-10-27 MED ORDER — IPRATROPIUM BROMIDE 0.06 % NA SOLN: 2.0000 | Freq: Four times a day (QID) | NASAL | 0 refills | Status: AC

## 2018-10-27 NOTE — ED Triage Notes (Signed)
Pt has been suffering from congestion and cough and epistaxis x2 weeks.  Pt has been taking Theraflu with no relief.

## 2018-10-27 NOTE — Discharge Instructions (Signed)
Start augmentin and prednisone as directed. Start flonase, atrovent nasal spray for nasal congestion/drainage. You can use over the counter nasal saline rinse such as neti pot for nasal congestion. Keep hydrated, your urine should be clear to pale yellow in color. Tylenol/motrin for fever and pain. Monitor for any worsening of symptoms, chest pain, shortness of breath, wheezing, swelling of the throat, follow up for reevaluation.

## 2018-10-27 NOTE — ED Provider Notes (Signed)
[facility]    CSN: [id number] Arrival date & time: 10/27/18  1623     History   Chief Complaint Chief Complaint  Patient presents with  . URI    HPI Belinda Martinez is a 28 y.o. female.   28 year old female comes in for 2-week history of URI symptoms.  Has had nasal congestion, rhinorrhea, cough, sneezing.  Denies fever, chills, night sweats.  States throughout the past 2 weeks, has had worsening cough at nighttime and increased sinus pressure.  She has been trying OTC cold medication without relief.  No obvious sick contact.     Past Medical History:  Diagnosis Date  . Medical history non-contributory     Patient Active Problem List   Diagnosis Date Noted  . Postpartum care following vaginal delivery (2/21) 10/22/2015  . Prolonged spontaneous rupture of membranes 10/21/2015  . Club foot, fetal, affecting care of mother, antepartum 06/20/2015  . Sickle cell trait (HCC) 06/20/2015    Past Surgical History:  Procedure Laterality Date  . CHOLECYSTECTOMY      OB History    Gravida  1   Para  1   Term  1   Preterm      AB      Living  1     SAB      TAB      Ectopic      Multiple  0   Live Births  1            Home Medications    Prior to Admission medications   Medication Sig Start Date End Date Taking? Authorizing Provider  amoxicillin-clavulanate (AUGMENTIN) 875-125 MG tablet Take 1 tablet by mouth every 12 (twelve) hours. 10/27/18   Cathie Hoops, Sherod Cisse V, PA-C  ibuprofen (ADVIL,MOTRIN) 600 MG tablet Take 1 tablet (600 mg total) by mouth every 6 (six) hours. 10/23/15   Donette Larry, CNM  ipratropium (ATROVENT) 0.06 % nasal spray Place 2 sprays into both nostrils 4 (four) times daily. 10/27/18   Cathie Hoops, Livvy Spilman V, PA-C  predniSONE (DELTASONE) 50 MG tablet Take 1 tablet (50 mg total) by mouth daily. 10/27/18   Belinda Fisher, PA-C  Prenatal Vit-Fe Fumarate-FA (PRENATAL VITAMIN PO) Take 1 tablet by mouth daily.     [provider]     Family History Family History  Problem Relation Age of Onset  . Diabetes Father   . Hypertension Father     Social History Social History   Tobacco Use  . Smoking status: Never Smoker  . Smokeless tobacco: Never Used  Substance Use Topics  . Alcohol use: Yes  . Drug use: No     Allergies   Citric acid   Review of Systems Review of Systems  Reason unable to perform ROS: See HPI as above.     Physical Exam Triage Vital Signs ED Triage Vitals [10/27/18 1708]  Enc Vitals Group     BP      Pulse Rate (!) 55     Resp      Temp 97.9 F (36.6 C)     Temp Source Oral     SpO2 100 %     Weight      Height      Head Circumference      Peak Flow      Pain Score 0     Pain Loc      Pain Edu?      Excl. in GC?  No data found.  Updated Vital Signs Pulse (!) 55   Temp 97.9 F (36.6 C) (Oral)   LMP 10/27/2018 (Exact Date)   SpO2 100%    Physical Exam Constitutional:      General: She is not in acute distress.    Appearance: She is well-developed. She is not ill-appearing, toxic-appearing or diaphoretic.  HENT:     Head: Normocephalic and atraumatic.     Right Ear: Tympanic membrane, ear canal and external ear normal. Tympanic membrane is not erythematous or bulging.     Left Ear: Tympanic membrane, ear canal and external ear normal. Tympanic membrane is not erythematous or bulging.     Nose: Nose normal.     Right Sinus: No maxillary sinus tenderness or frontal sinus tenderness.     Left Sinus: No maxillary sinus tenderness or frontal sinus tenderness.     Mouth/Throat:     Mouth: Mucous membranes are moist.     Pharynx: Oropharynx is clear. Uvula midline.  Eyes:     Conjunctiva/sclera: Conjunctivae normal.     Pupils: Pupils are equal, round, and reactive to light.  Neck:     Musculoskeletal: Normal range of motion and neck supple.  Cardiovascular:     Rate and Rhythm: Normal rate and regular rhythm.     Heart sounds: Normal heart sounds. No  murmur. No friction rub. No gallop.   Pulmonary:     Effort: Pulmonary effort is normal. No accessory muscle usage, prolonged expiration, respiratory distress or retractions.     Breath sounds: Normal breath sounds. No stridor, decreased air movement or transmitted upper airway sounds. No decreased breath sounds, wheezing, rhonchi or rales.  Skin:    General: Skin is warm and dry.  Neurological:     Mental Status: She is alert and oriented to person, place, and time.      UC Treatments / Results  Labs (all labs ordered are listed, but only abnormal results are displayed) Labs Reviewed - No data to display  EKG None  Radiology No results found.  Procedures Procedures (including critical care time)  Medications Ordered in UC Medications - No data to display  Initial Impression / Assessment and Plan / UC Course  I have reviewed the triage vital signs and the nursing notes.  Pertinent labs & imaging results that were available during my care of the patient were reviewed by me and considered in my medical decision making (see chart for details).    Start Augmentin and prednisone as directed.  Other symptomatic treatment discussed.  Push fluids.  Return precautions given.  Patient expresses understanding and agrees to plan.  Final Clinical Impressions(s) / UC Diagnoses   Final diagnoses:  Acute non-recurrent pansinusitis    ED Prescriptions    Medication Sig Dispense Auth. Provider   predniSONE (DELTASONE) 50 MG tablet Take 1 tablet (50 mg total) by mouth daily. 5 tablet Isaid Salvia V, PA-C   amoxicillin-clavulanate (AUGMENTIN) 875-125 MG tablet Take 1 tablet by mouth every 12 (twelve) hours. 14 tablet Jadis Pitter V, PA-C   ipratropium (ATROVENT) 0.06 % nasal spray Place 2 sprays into both nostrils 4 (four) times daily. 15 mL Threasa Alpha, [place] 10/27/18 1816

## 2020-05-14 ENCOUNTER — Other Ambulatory Visit: Payer: Self-pay

## 2020-05-14 ENCOUNTER — Other Ambulatory Visit: Payer: Medicaid Other

## 2020-05-14 DIAGNOSIS — Z20822 Contact with and (suspected) exposure to covid-19: Secondary | ICD-10-CM

## 2020-05-16 LAB — NOVEL CORONAVIRUS, NAA: SARS-CoV-2, NAA: DETECTED — AB

## 2020-05-16 LAB — SARS-COV-2, NAA 2 DAY TAT

## 2020-05-17 ENCOUNTER — Telehealth: Payer: Self-pay | Admitting: Nurse Practitioner

## 2020-05-17 NOTE — Telephone Encounter (Signed)
I called Belinda Martinez to discuss Covid symptoms and the use of casirivimab/imdevimab, a monoclonal antibody infusion for those with mild to moderate Covid symptoms and at a high risk of hospitalization.     Pt is qualified for this infusion at the monoclonal antibody infusion center due to co-morbid conditions and/or a member of an at-risk group, however declines infusion at this time. Symptoms tier reviewed as well as criteria for ending isolation.  Symptoms reviewed that would warrant ED/Hospital evaluation. Preventative practices reviewed. Patient verbalized understanding. Patient advised to call back if he decides that he does want to get infusion. Callback number to the infusion center given. Patient advised to go to Urgent care or ED with severe symptoms. Last date pt would be eligible for infusion is.   Nicolasa Ducking, NP

## 2020-06-04 ENCOUNTER — Ambulatory Visit: Payer: Self-pay | Admitting: Registered Nurse

## 2020-06-05 ENCOUNTER — Encounter: Payer: Self-pay | Admitting: Registered Nurse

## 2021-07-17 ENCOUNTER — Ambulatory Visit (INDEPENDENT_AMBULATORY_CARE_PROVIDER_SITE_OTHER): Payer: Self-pay

## 2021-07-17 ENCOUNTER — Other Ambulatory Visit: Payer: Self-pay

## 2021-07-17 ENCOUNTER — Ambulatory Visit
Admission: EM | Admit: 2021-07-17 | Discharge: 2021-07-17 | Disposition: A | Discharge status: Home / Self Care | Payer: Self-pay | Attending: Physician Assistant | Admitting: Physician Assistant

## 2021-07-17 ENCOUNTER — Encounter: Payer: Self-pay | Admitting: Emergency Medicine

## 2021-07-17 DIAGNOSIS — M79671 Pain in right foot: Secondary | ICD-10-CM

## 2021-07-17 DIAGNOSIS — W109XXA Fall (on) (from) unspecified stairs and steps, initial encounter: Secondary | ICD-10-CM

## 2021-07-17 MED ORDER — DICLOFENAC SODIUM 75 MG PO TBEC: 75.0000 mg | DELAYED_RELEASE_TABLET | Freq: Two times a day (BID) | ORAL | 0 refills | Status: AC

## 2021-07-17 NOTE — ED Triage Notes (Signed)
Fell down stairs over a week ago, right foot and ankle pain/swellings increasing over the last week. States it's hard to walk

## 2021-07-17 NOTE — Discharge Instructions (Addendum)
Return if any problems.  Wear a good supportive shoe.  Continue lidocaine patches

## 2021-07-19 NOTE — ED Provider Notes (Signed)
[facility]    CSN: [id number] Arrival date & time: 07/17/21  0818      History   Chief Complaint Chief Complaint  Patient presents with   Foot Pain    HPI Belinda Martinez is a 30 y.o. female.   The history is provided by the patient. No language interpreter was used.  Foot Pain This is a new problem. The current episode started more than 1 week ago. The problem occurs constantly. The problem has not changed since onset.Nothing aggravates the symptoms. Nothing relieves the symptoms. She has tried nothing for the symptoms. The treatment provided no relief.   Past Medical History:  Diagnosis Date   Medical history non-contributory     Patient Active Problem List   Diagnosis Date Noted   Postpartum care following vaginal delivery (2/21) 10/22/2015   Prolonged spontaneous rupture of membranes 10/21/2015   Club foot, fetal, affecting care of mother, antepartum 06/20/2015   Sickle cell trait (HCC) 06/20/2015    Past Surgical History:  Procedure Laterality Date   CHOLECYSTECTOMY      OB History     Gravida  1   Para  1   Term  1   Preterm      AB      Living  1      SAB      IAB      Ectopic      Multiple  0   Live Births  1            Home Medications    Prior to Admission medications   Medication Sig Start Date End Date Taking? Authorizing Provider  diclofenac (VOLTAREN) 75 MG EC tablet Take 1 tablet (75 mg total) by mouth 2 (two) times daily. 07/17/21  Yes Cheron Schaumann K, PA-C  amoxicillin-clavulanate (AUGMENTIN) 875-125 MG tablet Take 1 tablet by mouth every 12 (twelve) hours. 10/27/18   Cathie Hoops, Amy V, PA-C  ibuprofen (ADVIL,MOTRIN) 600 MG tablet Take 1 tablet (600 mg total) by mouth every 6 (six) hours. 10/23/15   Donette Larry, CNM  ipratropium (ATROVENT) 0.06 % nasal spray Place 2 sprays into both nostrils 4 (four) times daily. 10/27/18   Cathie Hoops, Amy V, PA-C  predniSONE (DELTASONE) 50 MG tablet Take 1 tablet (50 mg total) by  mouth daily. 10/27/18   Belinda Fisher, PA-C  Prenatal Vit-Fe Fumarate-FA (PRENATAL VITAMIN PO) Take 1 tablet by mouth daily.     [provider]    Family History Family History  Problem Relation Age of Onset   Diabetes Father    Hypertension Father     Social History Social History   Tobacco Use   Smoking status: Never   Smokeless tobacco: Never  Vaping Use   Vaping Use: Never used  Substance Use Topics   Alcohol use: Yes   Drug use: No     Allergies   Citric acid   Review of Systems Review of Systems  All other systems reviewed and are negative.   Physical Exam Triage Vital Signs ED Triage Vitals  Enc Vitals Group     BP 07/17/21 0832 (!) 143/82     Pulse Rate 07/17/21 0832 65     Resp 07/17/21 0832 16     Temp 07/17/21 0832 98.2 F (36.8 C)     Temp Source 07/17/21 0832 Oral     SpO2 07/17/21 0832 98 %     Weight --      Height --  Head Circumference --      Peak Flow --      Pain Score 07/17/21 0833 7     Pain Loc --      Pain Edu? --      Excl. in GC? --    No data found.  Updated Vital Signs BP (!) 143/82 (BP Location: Left Arm)   Pulse 65   Temp 98.2 F (36.8 C) (Oral)   Resp 16   SpO2 98%   Visual Acuity Right Eye Distance:   Left Eye Distance:   Bilateral Distance:    Right Eye Near:   Left Eye Near:    Bilateral Near:     Physical Exam Vitals and nursing note reviewed.  Constitutional:      Appearance: She is well-developed.  HENT:     Head: Normocephalic.  Pulmonary:     Effort: Pulmonary effort is normal.  Abdominal:     General: There is no distension.  Musculoskeletal:        General: Swelling and tenderness present. Normal range of motion.     Cervical back: Normal range of motion.     Comments: Swollen left foot,  diffusely tender,  nv and ns intact   Skin:    General: Skin is warm.  Neurological:     Mental Status: She is alert and oriented to person, place, and time.     UC Treatments / Results   Labs (all labs ordered are listed, but only abnormal results are displayed) Labs Reviewed - No data to display  EKG   Radiology No results found.  Procedures Procedures (including critical care time)  Medications Ordered in UC Medications - No data to display  Initial Impression / Assessment and Plan / UC Course  I have reviewed the triage vital signs and the nursing notes.  Pertinent labs & imaging results that were available during my care of the patient were reviewed by me and considered in my medical decision making (see chart for details).     Final Clinical Impressions(s) / UC Diagnoses   Final diagnoses:  Foot pain, right     Discharge Instructions      Return if any problems.  Wear a good supportive shoe.  Continue lidocaine patches    ED Prescriptions     Medication Sig Dispense Auth. Provider   diclofenac (VOLTAREN) 75 MG EC tablet Take 1 tablet (75 mg total) by mouth 2 (two) times daily. 20 tablet Elson Areas, [place]      PDMP not reviewed this encounter. An After Visit Summary was printed and given to the patient.    Elson Areas, [place] 07/19/21 1358

## 2022-02-26 IMAGING — DX DG FOOT COMPLETE 3+V*R*
3 series · 3 of 3 positions shown · non-contrast
Comparison: None.

CLINICAL DATA: Fell down stairs, increasing pain and swelling

EXAM:
RIGHT FOOT COMPLETE - 3+ VIEW

[foot supine dp]
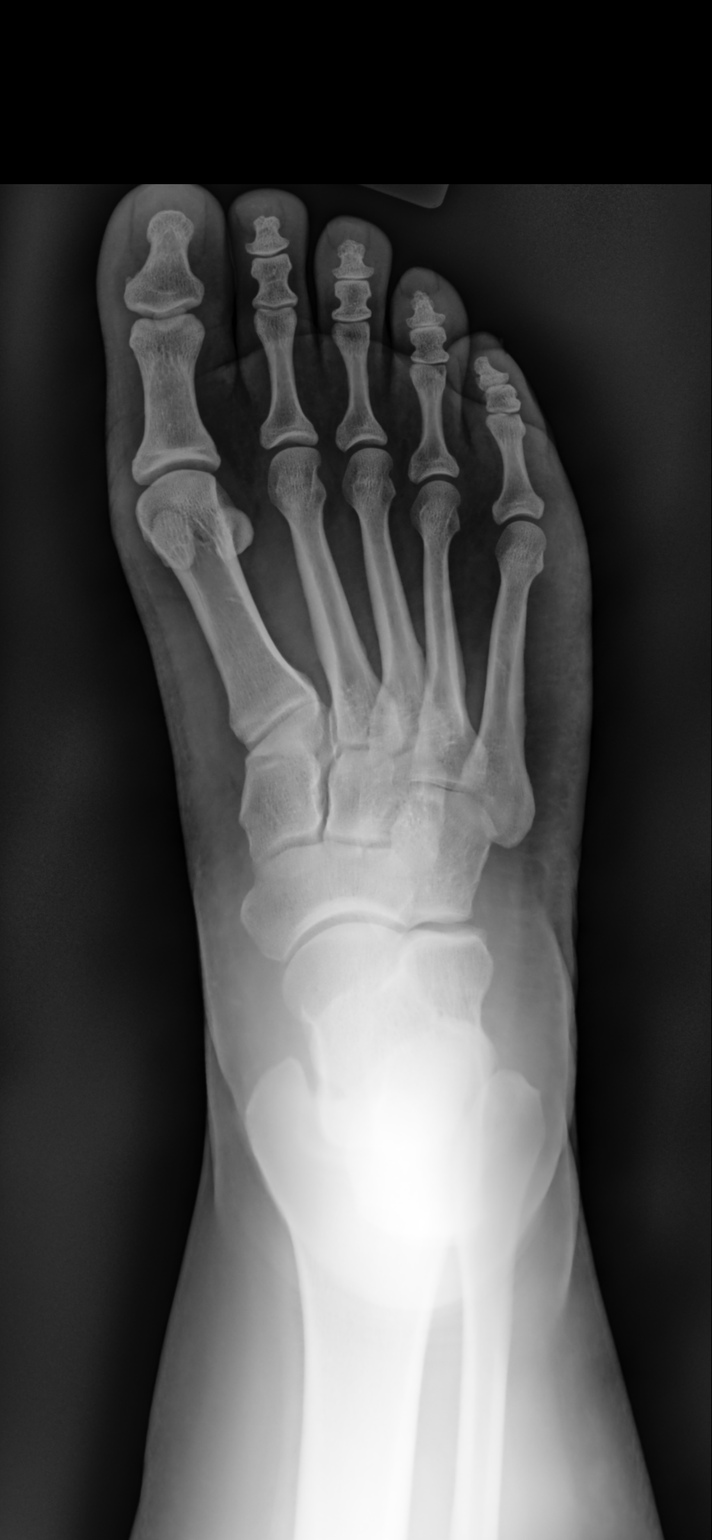

[foot medial oblique]
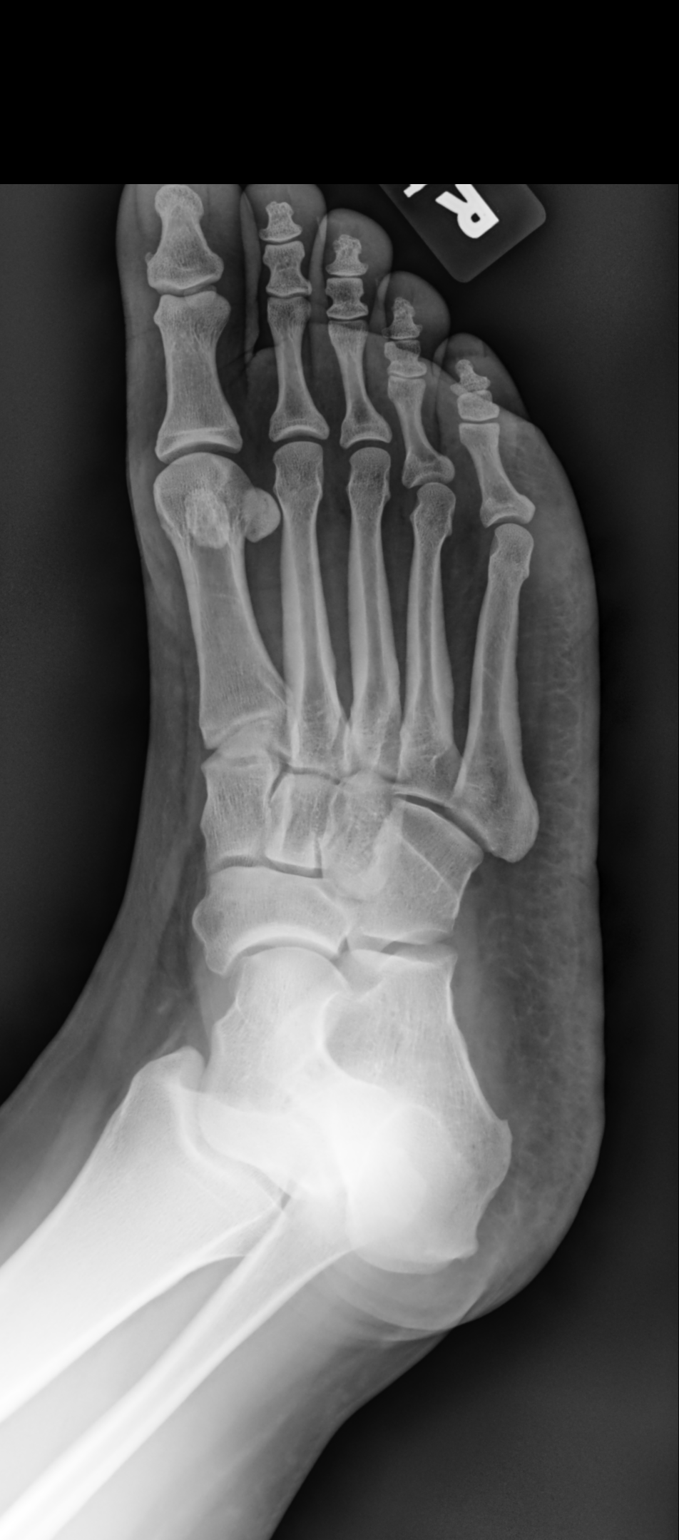

[foot supine lat]
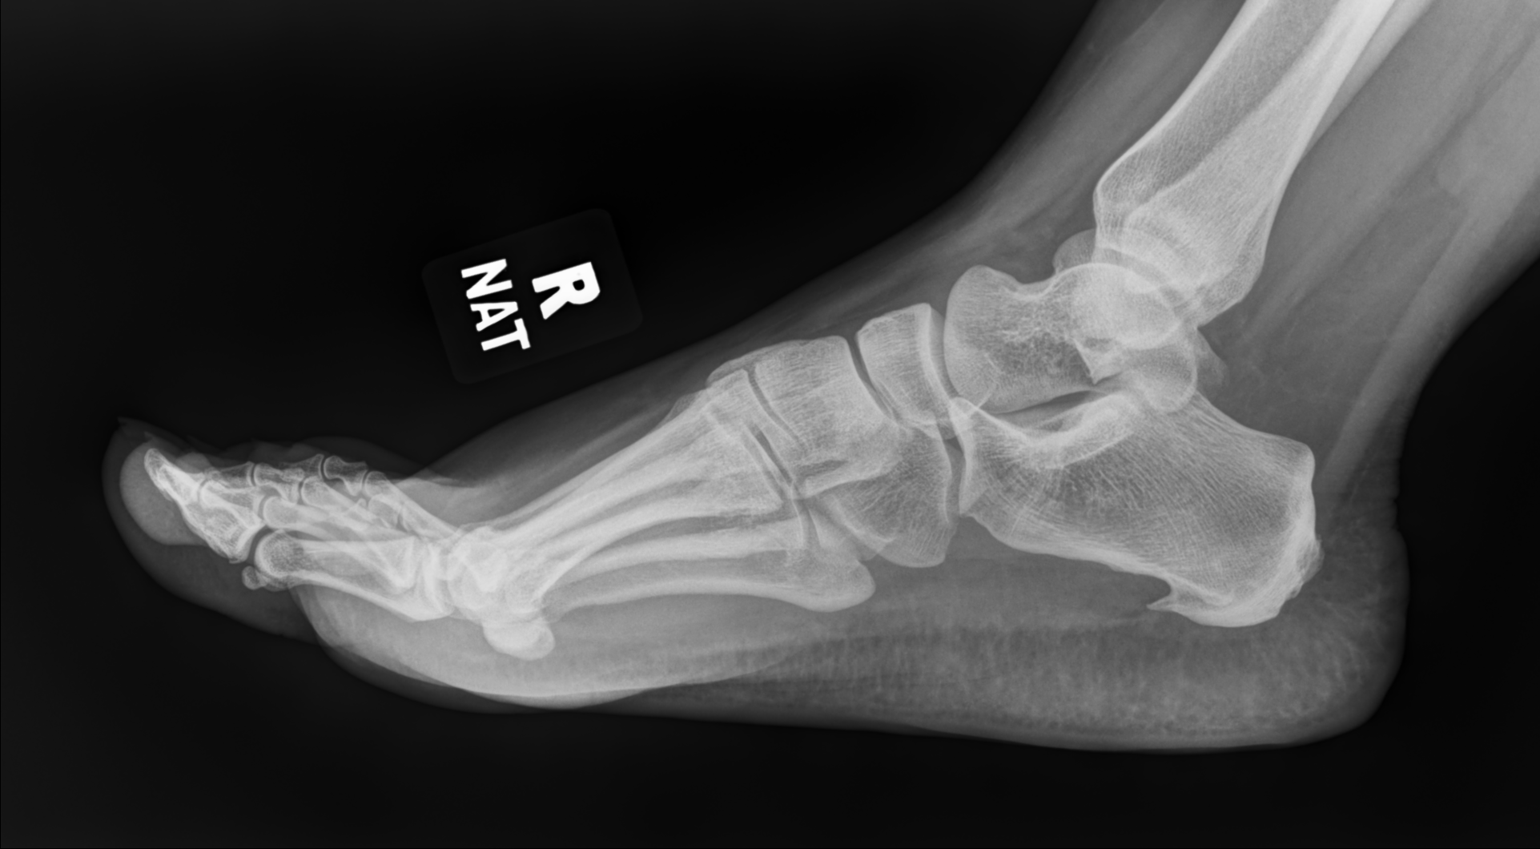

[3 of 3 positions shown; findings below may reference images not displayed]

FINDINGS: There is no evidence of fracture or dislocation. There is no
evidence of arthropathy or other focal bone abnormality. Diffuse
soft tissue edema about the lateral foot.
IMPRESSION: 1.  No fracture or dislocation of the right foot.

2.  Diffuse soft tissue edema about the lateral foot.

## 2024-03-29 ENCOUNTER — Other Ambulatory Visit: Payer: Self-pay

## 2024-03-29 DIAGNOSIS — N6342 Unspecified lump in left breast, subareolar: Secondary | ICD-10-CM

## 2024-04-05 ENCOUNTER — Ambulatory Visit
Admission: RE | Admit: 2024-04-05 | Discharge: 2024-04-05 | Disposition: A | Discharge status: Home / Self Care | Source: Ambulatory Visit

## 2024-04-05 DIAGNOSIS — N6342 Unspecified lump in left breast, subareolar: Secondary | ICD-10-CM

## 2024-04-06 ENCOUNTER — Other Ambulatory Visit: Payer: Self-pay

## 2024-04-06 DIAGNOSIS — N6320 Unspecified lump in the left breast, unspecified quadrant: Secondary | ICD-10-CM

## 2024-10-08 ENCOUNTER — Other Ambulatory Visit
